# Patient Record
Sex: Male | Born: 1990
Health system: Southern US, Community
[De-identification: ages and names within clinical notes are randomized; demographics above are authoritative.]

## PROBLEM LIST (undated history)

## (undated) DIAGNOSIS — K219 Gastro-esophageal reflux disease without esophagitis: Secondary | ICD-10-CM

## (undated) DIAGNOSIS — J189 Pneumonia, unspecified organism: Secondary | ICD-10-CM

## (undated) DIAGNOSIS — F419 Anxiety disorder, unspecified: Secondary | ICD-10-CM

## (undated) DIAGNOSIS — I514 Myocarditis, unspecified: Secondary | ICD-10-CM

## (undated) DIAGNOSIS — I10 Essential (primary) hypertension: Secondary | ICD-10-CM

---
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
## 2011-01-19 LAB — DIFFERENTIAL
Eosinophils Absolute: 0.4 10*3/uL (ref 0.0–1.2)
Eosinophils Relative: 5 % (ref 0–5)
Lymphs Abs: 2.6 10*3/uL (ref 1.1–4.8)
Neutro Abs: 3.1 10*3/uL (ref 1.7–8.0)
Neutrophils Relative %: 43 % (ref 43–71)

## 2011-01-19 LAB — INFLUENZA A & B ANTIBODIES
Influenza A Virus Ab, IgG: 1.51 IV
Influenza A Virus Ab, IgM: 0.22 IV
Influenza B Virus Ab, IgM: 0.09 IV
Influenza B ab, IgG: 1.73 IV

## 2011-01-19 LAB — TROPONIN I: Troponin I: 2.76 ng/mL (ref 0.00–0.06)

## 2011-01-19 LAB — RAPID URINE DRUG SCREEN, HOSP PERFORMED
Opiates: NOT DETECTED
Tetrahydrocannabinol: POSITIVE — AB

## 2011-01-19 LAB — COXSACKIE B VIRUS ANTIBODIES
Coxsackie B1 Ab: <1:8 {titer}
Coxsackie B3 Ab: <1:8 {titer}
Coxsackie B6 Ab: <1:8 {titer}

## 2011-01-19 LAB — ECHOVIRUS ABS PANEL (CSF)
Echovirus Ab Type 11: <1:10 {titer}
Echovirus Ab Type 6: <1:10 {titer}
Echovirus Ab Type 7: <1:10 {titer}
Echovirus Ab Type 9: <1:10 {titer}

## 2011-01-19 LAB — CBC
Hemoglobin: 14.1 g/dL (ref 12.0–16.0)
MCHC: 34.1 g/dL (ref 31.0–37.0)
MCV: 88.4 fL (ref 78.0–98.0)
Platelets: 200 10*3/uL (ref 150–400)
RDW: 12.9 % (ref 11.4–15.5)
WBC: 7.1 10*3/uL (ref 4.5–13.5)

## 2011-01-19 LAB — URINALYSIS, ROUTINE W REFLEX MICROSCOPIC
Bilirubin Urine: NEGATIVE
Nitrite: NEGATIVE
Urobilinogen, UA: 0.2 mg/dL (ref 0.0–1.0)
pH: 6 (ref 5.0–8.0)

## 2011-01-19 LAB — EBV AB TO VIRAL CAPSID AG PNL, IGG+IGM: EBV VCA IgM: 0.2 {ISR}

## 2011-01-19 LAB — ADENOVIRUS ANTIBODIES: ADP: 0.26 IV

## 2011-01-19 LAB — POCT CARDIAC MARKERS: CKMB, poc: 54.5 ng/mL (ref 1.0–8.0)

## 2011-01-19 LAB — CMV ABS, IGG+IGM (CYTOMEGALOVIRUS)
CMV IgM: <8 AU/mL (ref <30.0)
Cytomegalovirus Ab-IgG: 4.4 U/mL — ABNORMAL HIGH (ref <0.4)

## 2011-05-29 IMAGING — CR DG HAND COMPLETE 3+V*R*
3 series · 3 of 3 positions shown · non-contrast
Comparison: None.

CLINICAL DATA: Hand injury and pain.

RIGHT HAND - COMPLETE 3+ VIEW

[x hand pa right]
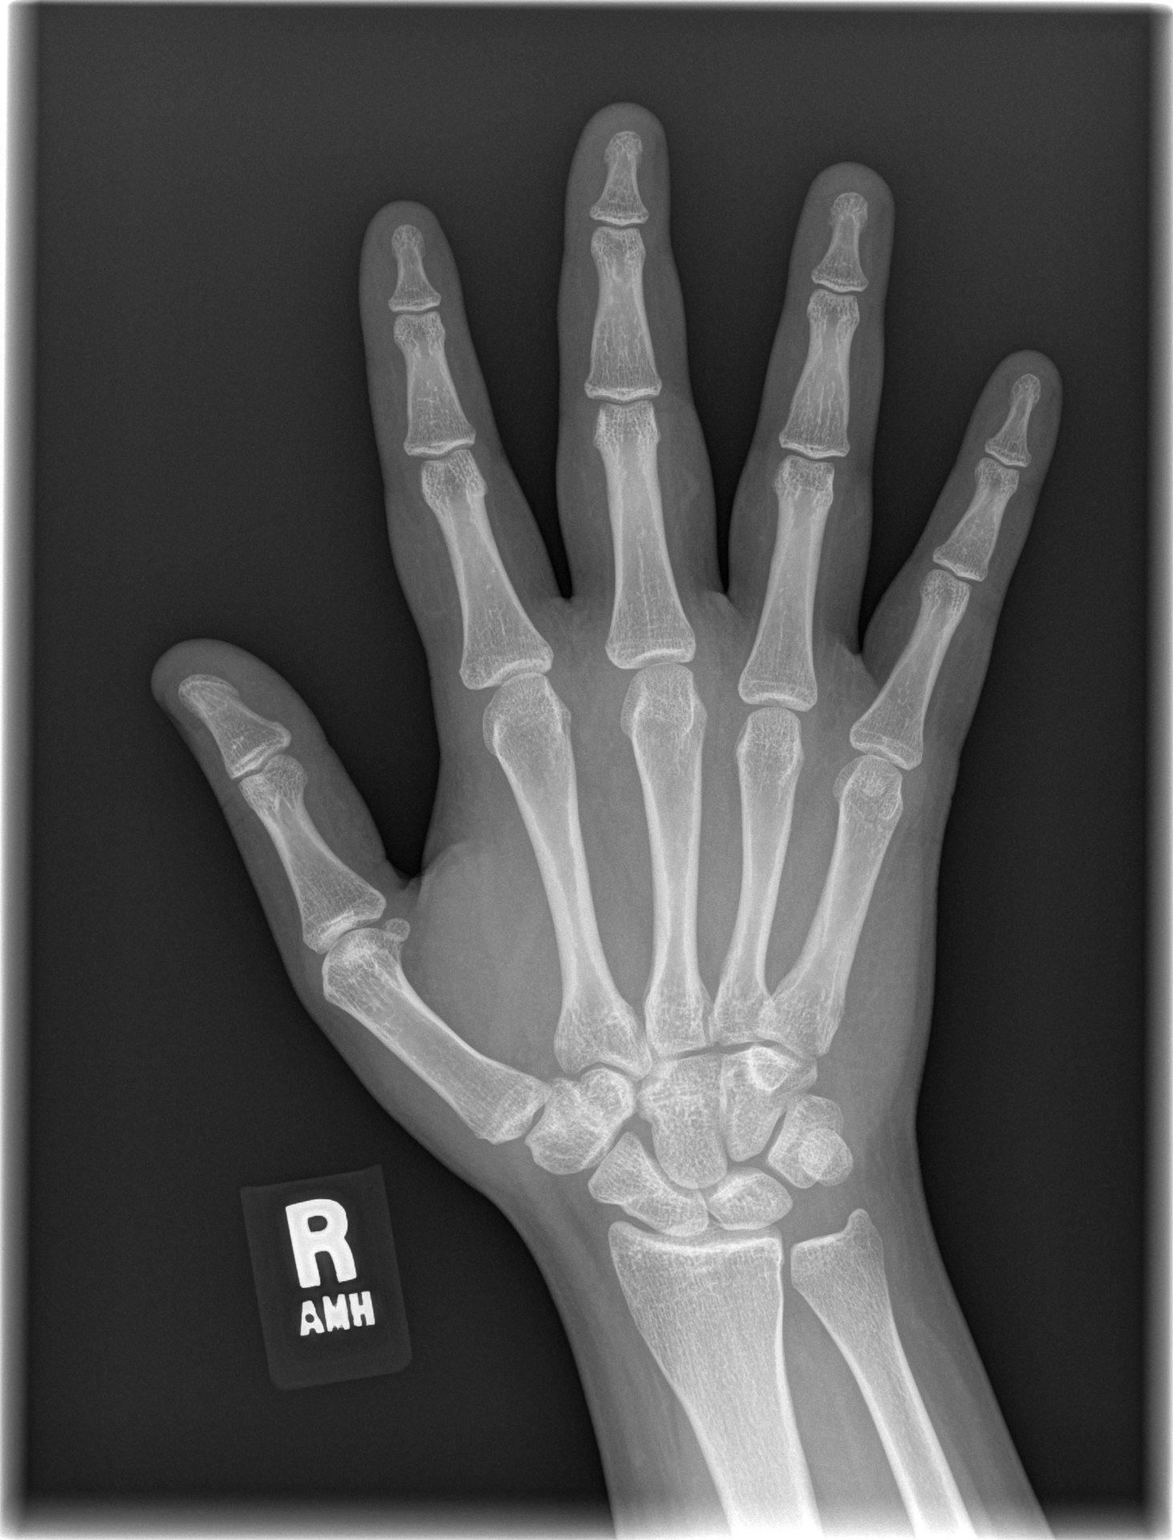

[x hand oblique right]
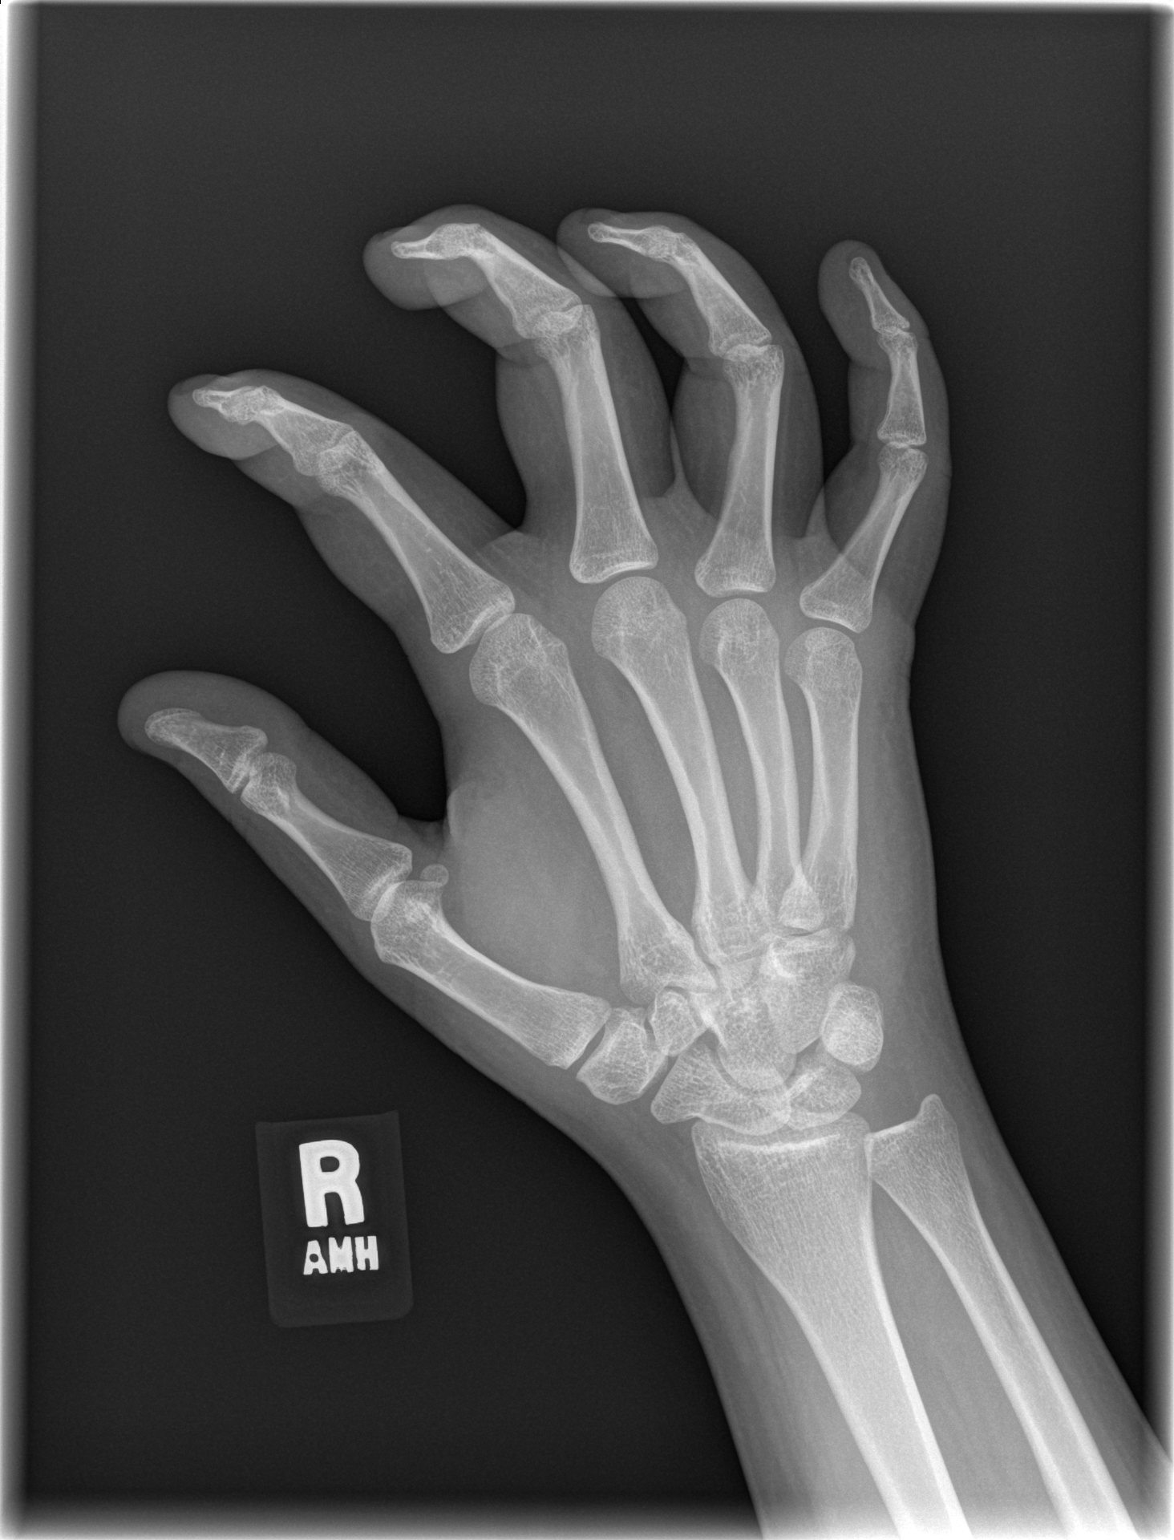

[x hand lat right]
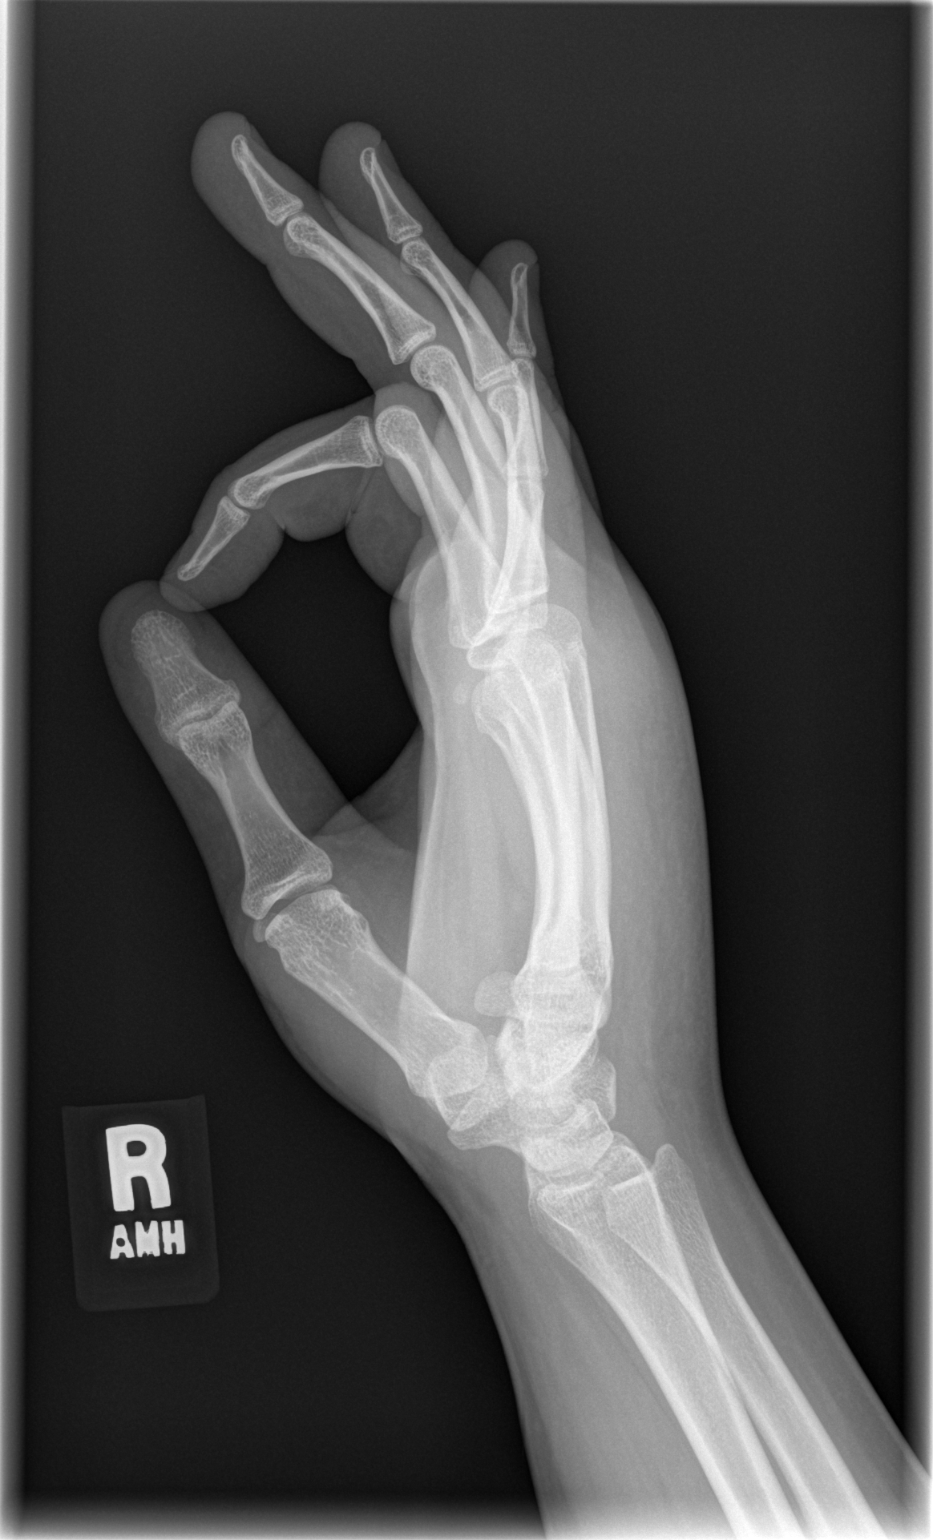

[3 of 3 positions shown; findings below may reference images not displayed]

FINDINGS: Soft tissues of the hand appear swollen.  No fracture or
dislocation is identified.
IMPRESSION: Soft tissue swelling without underlying fracture.

## 2012-03-19 ENCOUNTER — Emergency Department (HOSPITAL_BASED_OUTPATIENT_CLINIC_OR_DEPARTMENT_OTHER)
Admission: EM | Admit: 2012-03-19 | Discharge: 2012-03-19 | Disposition: A | Discharge status: Home / Self Care | Payer: Self-pay | Attending: Emergency Medicine | Admitting: Emergency Medicine

## 2012-03-19 ENCOUNTER — Encounter (HOSPITAL_BASED_OUTPATIENT_CLINIC_OR_DEPARTMENT_OTHER): Payer: Self-pay

## 2012-03-19 ENCOUNTER — Encounter (HOSPITAL_BASED_OUTPATIENT_CLINIC_OR_DEPARTMENT_OTHER): Payer: Self-pay | Admitting: *Deleted

## 2012-03-19 ENCOUNTER — Emergency Department (HOSPITAL_BASED_OUTPATIENT_CLINIC_OR_DEPARTMENT_OTHER): Payer: Self-pay

## 2012-03-19 ENCOUNTER — Emergency Department (HOSPITAL_BASED_OUTPATIENT_CLINIC_OR_DEPARTMENT_OTHER)
Admission: EM | Admit: 2012-03-19 | Discharge: 2012-03-19 | Discharge status: Against Medical Advice | Payer: Self-pay | Attending: Emergency Medicine | Admitting: Emergency Medicine

## 2012-03-19 DIAGNOSIS — F17200 Nicotine dependence, unspecified, uncomplicated: Secondary | ICD-10-CM | POA: Insufficient documentation

## 2012-03-19 DIAGNOSIS — R059 Cough, unspecified: Secondary | ICD-10-CM | POA: Insufficient documentation

## 2012-03-19 DIAGNOSIS — R05 Cough: Secondary | ICD-10-CM | POA: Insufficient documentation

## 2012-03-19 DIAGNOSIS — R5381 Other malaise: Secondary | ICD-10-CM | POA: Insufficient documentation

## 2012-03-19 DIAGNOSIS — R630 Anorexia: Secondary | ICD-10-CM | POA: Insufficient documentation

## 2012-03-19 DIAGNOSIS — R5383 Other fatigue: Secondary | ICD-10-CM | POA: Insufficient documentation

## 2012-03-19 DIAGNOSIS — M549 Dorsalgia, unspecified: Secondary | ICD-10-CM | POA: Insufficient documentation

## 2012-03-19 DIAGNOSIS — R52 Pain, unspecified: Secondary | ICD-10-CM | POA: Insufficient documentation

## 2012-03-19 DIAGNOSIS — R079 Chest pain, unspecified: Secondary | ICD-10-CM | POA: Insufficient documentation

## 2012-03-19 DIAGNOSIS — J189 Pneumonia, unspecified organism: Secondary | ICD-10-CM | POA: Insufficient documentation

## 2012-03-19 DIAGNOSIS — R6883 Chills (without fever): Secondary | ICD-10-CM | POA: Insufficient documentation

## 2012-03-19 DIAGNOSIS — Z8679 Personal history of other diseases of the circulatory system: Secondary | ICD-10-CM | POA: Insufficient documentation

## 2012-03-19 HISTORY — DX: Myocarditis, unspecified: I51.4

## 2012-03-19 LAB — CBC
HCT: 40.8 % (ref 39.0–52.0)
Hemoglobin: 14.4 g/dL (ref 13.0–17.0)
MCH: 30.7 pg (ref 26.0–34.0)
MCHC: 35.3 g/dL (ref 30.0–36.0)

## 2012-03-19 LAB — BASIC METABOLIC PANEL
BUN: 8 mg/dL (ref 6–23)
CO2: 26 mEq/L (ref 19–32)
Calcium: 9.6 mg/dL (ref 8.4–10.5)
GFR calc non Af Amer: >90 mL/min (ref >90)
Glucose, Bld: 134 mg/dL — ABNORMAL HIGH (ref 70–99)
Potassium: 3.5 mEq/L (ref 3.5–5.1)

## 2012-03-19 MED ORDER — LEVOFLOXACIN 500 MG PO TABS
500.0000 mg | ORAL_TABLET | Freq: Once | ORAL | Status: AC
  Administered 2012-03-19: 500 mg via ORAL
  Filled 2012-03-19: qty 1

## 2012-03-19 MED ORDER — LEVOFLOXACIN 500 MG PO TABS: 500.0000 mg | ORAL_TABLET | Freq: Every day | ORAL | Status: DC

## 2012-03-19 MED ORDER — HYDROCODONE-HOMATROPINE 5-1.5 MG/5ML PO SYRP: 2.5000 mL | ORAL_SOLUTION | Freq: Four times a day (QID) | ORAL | Status: DC | PRN

## 2012-03-19 NOTE — ED Notes (Signed)
C/o generalized body aches, fatigue, chills x 3 days

## 2012-03-19 NOTE — ED Provider Notes (Signed)
History     CSN: [id number]  Arrival date & time 03/19/12  1532   First MD Initiated Contact with Patient 03/19/12 1624      Chief Complaint  Patient presents with  . URI    (Consider location/radiation/quality/duration/timing/severity/associated sxs/prior treatment) HPI The patient presents with concerns of complaints that began approximately 3 days ago.  Over the past 3 days he has had diffuse generalized discomfort, fatigue, chills.  He notes concurrent cough, mild anorexia.  No relief with OTC medication.  He denies confusion, disorientation, lightheadedness, syncope, chest pain.  He does endorse pain with coughing, and both his chest and back. He notes that he was in his usual state of health until this point. The patient smokes. Past Medical History  Diagnosis Date  . Myocarditis     History reviewed. No pertinent past surgical history.  History reviewed. No pertinent family history.  History  Substance Use Topics  . Smoking status: Current Every Day Smoker  . Smokeless tobacco: Not on file  . Alcohol Use: No      Review of Systems  Constitutional:       Per HPI, otherwise negative  HENT:       Per HPI, otherwise negative  Eyes: Negative.   Respiratory:       Per HPI, otherwise negative  Cardiovascular:       Per HPI, otherwise negative  Gastrointestinal: Negative for vomiting.  Genitourinary: Negative.   Musculoskeletal:       Per HPI, otherwise negative  Skin: Negative.   Neurological: Negative for syncope.    Allergies  Amoxil  Home Medications  No current outpatient prescriptions on file.  BP 144/78  Pulse 111  Temp 99 F (37.2 C) (Oral)  Resp 16  Ht 5\' 5"  (1.651 m)  Wt 180 lb (81.647 kg)  BMI 29.95 kg/m2  SpO2 100%  Physical Exam  Nursing note and vitals reviewed. Constitutional: He is oriented to person, place, and time. He appears well-developed. No distress.  HENT:  Head: Normocephalic and atraumatic.  Eyes: Conjunctivae normal  and EOM are normal.  Cardiovascular: Regular rhythm.  Tachycardia present.   Pulmonary/Chest: Effort normal. No stridor. No respiratory distress. He has decreased breath sounds in the right upper field, the right middle field and the right lower field. He has no wheezes. He has no rhonchi.  Abdominal: He exhibits no distension.  Musculoskeletal: He exhibits no edema.  Neurological: He is alert and oriented to person, place, and time.  Skin: Skin is warm and dry.  Psychiatric: He has a normal mood and affect.    ED Course  Procedures (including critical care time)  Labs Reviewed  BASIC METABOLIC PANEL - Abnormal; Notable for the following:    Glucose, Bld 134 (*)     All other components within normal limits  CBC   Dg Chest 2 View  03/19/2012  *RADIOLOGY REPORT*  Clinical Data: Cough, congestion, fever, history smoking, myocarditis  CHEST - 2 VIEW  Comparison: 09/15/2009  Findings: Normal heart size, mediastinal contours, and pulmonary vascularity. Mild infiltrate identified in right lower lobe consistent with pneumonia. Remaining lungs clear. No pleural effusion or pneumothorax.  IMPRESSION: Right lower lobe infiltrate consistent with pneumonia.   Original Report Authenticated By: Ulyses Southward, M.D.      No diagnosis found.  Pulse ox 99% room air normal I interpreted the x-ray, demonstrated it to the patient. We discussed the need for smoking cessation.   MDM  This generally well young male  presents with several days of cough, generalized complaints.  On exam she is uncomfortable appearing, though in no distress.  The patient is mildly tachycardic, but afebrile.  He does have diminished lung sounds on the right.  This is consistent with the pneumonia demonstrated on x-ray.  He was started on appropriate therapy for community acquired pneumonia.  He was discharged in stable condition.      Gerhard Munch, MD 03/19/12 [phone]

## 2012-03-19 NOTE — ED Notes (Signed)
Pt c/o flu like symptoms  x 2 days 

## 2012-04-07 ENCOUNTER — Emergency Department (HOSPITAL_BASED_OUTPATIENT_CLINIC_OR_DEPARTMENT_OTHER)
Admission: EM | Admit: 2012-04-07 | Discharge: 2012-04-07 | Disposition: A | Discharge status: Home / Self Care | Payer: Self-pay | Attending: Emergency Medicine | Admitting: Emergency Medicine

## 2012-04-07 ENCOUNTER — Encounter (HOSPITAL_BASED_OUTPATIENT_CLINIC_OR_DEPARTMENT_OTHER): Payer: Self-pay | Admitting: *Deleted

## 2012-04-07 DIAGNOSIS — H60399 Other infective otitis externa, unspecified ear: Secondary | ICD-10-CM | POA: Insufficient documentation

## 2012-04-07 DIAGNOSIS — I514 Myocarditis, unspecified: Secondary | ICD-10-CM | POA: Insufficient documentation

## 2012-04-07 DIAGNOSIS — H6010 Cellulitis of external ear, unspecified ear: Secondary | ICD-10-CM

## 2012-04-07 DIAGNOSIS — F17200 Nicotine dependence, unspecified, uncomplicated: Secondary | ICD-10-CM | POA: Insufficient documentation

## 2012-04-07 MED ORDER — CLINDAMYCIN HCL 150 MG PO CAPS
300.0000 mg | ORAL_CAPSULE | Freq: Once | ORAL | Status: AC
  Administered 2012-04-07: 300 mg via ORAL
  Filled 2012-04-07: qty 2

## 2012-04-07 MED ORDER — CLINDAMYCIN HCL 150 MG PO CAPS: 150.0000 mg | ORAL_CAPSULE | Freq: Four times a day (QID) | ORAL | Status: DC

## 2012-04-07 MED ORDER — OXYCODONE-ACETAMINOPHEN 5-325 MG PO TABS: 1.0000 | ORAL_TABLET | ORAL | Status: AC | PRN

## 2012-04-07 NOTE — ED Provider Notes (Signed)
History     CSN: [id number]  Arrival date & time 04/07/12  1052   First MD Initiated Contact with Patient 04/07/12 1135      Chief Complaint  Patient presents with  . Otalgia    (Consider location/radiation/quality/duration/timing/severity/associated sxs/prior treatment) HPI Patient with pain and swelling external right ear.  Began with symptoms 2 days ago. And swelling increased.  Patient with scratching in throat.  No fever or chills.   Past Medical History  Diagnosis Date  . Myocarditis     History reviewed. No pertinent past surgical history.  No family history on file.  History  Substance Use Topics  . Smoking status: Current Every Day Smoker -- 0.5 packs/day    Types: Cigarettes  . Smokeless tobacco: Not on file  . Alcohol Use: No      Review of Systems  All other systems reviewed and are negative.    Allergies  Amoxil  Home Medications   Current Outpatient Rx  Name  Route  Sig  Dispense  Refill  . HYDROCODONE-HOMATROPINE 5-1.5 MG/5ML PO SYRP   Oral   Take 2.5 mLs by mouth every 6 (six) hours as needed for cough or pain.   30 mL   0   . LEVOFLOXACIN 500 MG PO TABS   Oral   Take 1 tablet (500 mg total) by mouth daily.   6 tablet   0     BP 134/85  Pulse 108  Temp 98 F (36.7 C) (Oral)  Resp 18  SpO2 98%  Physical Exam  Nursing note and vitals reviewed. Constitutional: He is oriented to person, place, and time. He appears well-developed and well-nourished.  HENT:  Head: Normocephalic and atraumatic.  Right Ear: External ear normal.  Left Ear: External ear normal.  Ears:  Nose: Nose normal.  Mouth/Throat: Oropharynx is clear and moist.       Patient with erythema of ear with some exoriation, no fluctance or masses.   Eyes: Conjunctivae normal and EOM are normal. Pupils are equal, round, and reactive to light.  Neck: Normal range of motion. Neck supple.  Cardiovascular: Normal rate, regular rhythm, normal heart sounds and intact  distal pulses.   Pulmonary/Chest: Effort normal and breath sounds normal. No respiratory distress. He has no wheezes. He exhibits no tenderness.  Abdominal: Soft. Bowel sounds are normal. He exhibits no distension and no mass. There is no tenderness. There is no guarding.  Musculoskeletal: Normal range of motion.  Neurological: He is alert and oriented to person, place, and time. He has normal reflexes. He exhibits normal muscle tone. Coordination normal.  Skin: Skin is warm and dry.  Psychiatric: He has a normal mood and affect. His behavior is normal. Judgment and thought content normal.    ED Course  Procedures (including critical care time)  Labs Reviewed - No data to display No results found.   No diagnosis found.    MDM  Patient recently on levaquin.  Plan clindamycin and advised warm compresses and return for recheck if worse at any time or two days.        Hilario Quarry, MD 04/12/12 1255

## 2012-04-07 NOTE — ED Notes (Signed)
Pain in his right ear x 3 days. Swelling, redness and pain radiating down his neck. States it started as a pimple on the outside of his ear that he "popped".

## 2012-04-12 ENCOUNTER — Emergency Department (HOSPITAL_BASED_OUTPATIENT_CLINIC_OR_DEPARTMENT_OTHER)
Admission: EM | Admit: 2012-04-12 | Discharge: 2012-04-12 | Disposition: A | Discharge status: Home / Self Care | Payer: Self-pay | Attending: Emergency Medicine | Admitting: Emergency Medicine

## 2012-04-12 ENCOUNTER — Encounter (HOSPITAL_BASED_OUTPATIENT_CLINIC_OR_DEPARTMENT_OTHER): Payer: Self-pay | Admitting: *Deleted

## 2012-04-12 DIAGNOSIS — F17200 Nicotine dependence, unspecified, uncomplicated: Secondary | ICD-10-CM | POA: Insufficient documentation

## 2012-04-12 DIAGNOSIS — H7290 Unspecified perforation of tympanic membrane, unspecified ear: Secondary | ICD-10-CM | POA: Insufficient documentation

## 2012-04-12 DIAGNOSIS — Z8679 Personal history of other diseases of the circulatory system: Secondary | ICD-10-CM | POA: Insufficient documentation

## 2012-04-12 DIAGNOSIS — H60399 Other infective otitis externa, unspecified ear: Secondary | ICD-10-CM | POA: Insufficient documentation

## 2012-04-12 DIAGNOSIS — H6090 Unspecified otitis externa, unspecified ear: Secondary | ICD-10-CM

## 2012-04-12 MED ORDER — CIPROFLOXACIN-DEXAMETHASONE 0.3-0.1 % OT SUSP
4.0000 [drp] | Freq: Two times a day (BID) | OTIC | Status: DC
  Administered 2012-04-12: 4 [drp] via OTIC
  Filled 2012-04-12: qty 7.5

## 2012-04-12 NOTE — ED Notes (Signed)
Here for cellulitis right ear about a week ago. States ear popped and he lost hearing about 3 days ago. Bleeding from same, but none at present.

## 2012-04-12 NOTE — ED Provider Notes (Signed)
History     CSN: [id number]  Arrival date & time 04/12/12  1300   First MD Initiated Contact with Patient 04/12/12 1440      Chief Complaint  Patient presents with  . Otalgia    (Consider location/radiation/quality/duration/timing/severity/associated sxs/prior treatment) HPI Pt reports he was treated for ear lobe cellulitis about a week ago, finished the clinda and was doing better until three days ago when he head a pop and lost hearing in his R ear. He has had some bloody drainage from that ear since then. Minimal pain now.   Past Medical History  Diagnosis Date  . Myocarditis     History reviewed. No pertinent past surgical history.  History reviewed. No pertinent family history.  History  Substance Use Topics  . Smoking status: Current Every Day Smoker -- 0.5 packs/day    Types: Cigarettes  . Smokeless tobacco: Not on file  . Alcohol Use: No      Review of Systems All other systems reviewed and are negative except as noted in HPI. '  Allergies  Amoxil  Home Medications   Current Outpatient Rx  Name  Route  Sig  Dispense  Refill  . CLINDAMYCIN HCL 150 MG PO CAPS   Oral   Take 1 capsule (150 mg total) by mouth every 6 (six) hours.   28 capsule   0   . HYDROCODONE-HOMATROPINE 5-1.5 MG/5ML PO SYRP   Oral   Take 2.5 mLs by mouth every 6 (six) hours as needed for cough or pain.   30 mL   0   . LEVOFLOXACIN 500 MG PO TABS   Oral   Take 1 tablet (500 mg total) by mouth daily.   6 tablet   0   . OXYCODONE-ACETAMINOPHEN 5-325 MG PO TABS   Oral   Take 1 tablet by mouth every 4 (four) hours as needed for pain.   10 tablet   0     BP 125/73  Pulse 81  Temp 98.3 F (36.8 C) (Oral)  Resp 14  Wt 190 lb (86.183 kg)  SpO2 100%  Physical Exam  Nursing note and vitals reviewed. Constitutional: He is oriented to person, place, and time. He appears well-developed and well-nourished.  HENT:  Head: Normocephalic and atraumatic.  Right Ear: External  ear and ear canal normal.  Left Ear: Tympanic membrane and external ear normal.       Distal external canal and TM with erythema, crusting, and induration, no definite TM perforation seen  Eyes: EOM are normal. Pupils are equal, round, and reactive to light.  Neck: Normal range of motion. Neck supple.  Cardiovascular: Normal rate, normal heart sounds and intact distal pulses.   Pulmonary/Chest: Effort normal and breath sounds normal.  Abdominal: Bowel sounds are normal. He exhibits no distension. There is no tenderness.  Musculoskeletal: Normal range of motion. He exhibits no edema and no tenderness.  Neurological: He is alert and oriented to person, place, and time. He has normal strength. No cranial nerve deficit or sensory deficit.  Skin: Skin is warm and dry. No rash noted.  Psychiatric: He has a normal mood and affect.    ED Course  Procedures (including critical care time)  Labs Reviewed - No data to display No results found.   No diagnosis found.    MDM  Suspect TM perforation, although no definite perf seen on exam. Pt has signs of otitis externa, no otitis media. Topical Abx and ENT referral.  Charles B. Bernette Mayers, MD 04/12/12 1453

## 2016-05-28 ENCOUNTER — Encounter: Payer: Self-pay | Admitting: Family Medicine

## 2016-05-28 ENCOUNTER — Ambulatory Visit (INDEPENDENT_AMBULATORY_CARE_PROVIDER_SITE_OTHER): Payer: BLUE CROSS/BLUE SHIELD | Admitting: Family Medicine

## 2016-05-28 VITALS — BP 131/91 | HR 112 | Temp 97.9°F | Ht 65.0 in | Wt 206.0 lb

## 2016-05-28 DIAGNOSIS — J028 Acute pharyngitis due to other specified organisms: Secondary | ICD-10-CM | POA: Diagnosis not present

## 2016-05-28 DIAGNOSIS — B9789 Other viral agents as the cause of diseases classified elsewhere: Secondary | ICD-10-CM

## 2016-05-28 DIAGNOSIS — J029 Acute pharyngitis, unspecified: Secondary | ICD-10-CM

## 2016-05-28 LAB — CULTURE, GROUP A STREP

## 2016-05-28 LAB — RAPID STREP SCREEN (MED CTR MEBANE ONLY): Strep Gp A Ag, IA W/Reflex: NEGATIVE

## 2016-05-28 NOTE — Progress Notes (Signed)
BP (!) 131/91   Pulse (!) 112   Temp 97.9 F (36.6 C) (Oral)   Ht 5\' 5"  (1.651 m)   Wt 206 lb (93.4 kg)   BMI 34.28 kg/m    Subjective:    Patient ID: Ricky Evans, male    DOB: 1990-09-03, 26 y.o.   MRN: [id number]  HPI: Ricky Evans is a 26 y.o. male presenting on 05/28/2016 for Sore Throat; Fever; and Nausea   HPI Sore throat and fever and congestion  Patient has been having sore throat and fever and congestion is been going on since yesterday. He has been having a cough that is mostly nonproductive at this point. He has been having a lot of nasal drainage and nasal congestion and postnasal drainage. He denies any sick contacts that he knows of. He has not missed really use anything over-the-counter for this just yet. He wanted to get tested for strep because he thinks he might have strep pharyngitis.  Relevant past medical, surgical, family and social history reviewed and updated as indicated. Interim medical history since our last visit reviewed. Allergies and medications reviewed and updated.  Review of Systems  Constitutional: Positive for fever. Negative for chills.  HENT: Positive for congestion, postnasal drip, rhinorrhea, sinus pressure, sneezing and sore throat. Negative for ear discharge, ear pain and voice change.   Eyes: Negative for pain, discharge, redness and visual disturbance.  Respiratory: Positive for cough. Negative for shortness of breath and wheezing.   Cardiovascular: Negative for chest pain and leg swelling.  Musculoskeletal: Negative for gait problem.  Skin: Negative for rash.  All other systems reviewed and are negative.   Per HPI unless specifically indicated above  Social History   Social History  . Marital status: Single    Spouse name: N/A  . Number of children: N/A  . Years of education: N/A   Occupational History  . Not on file.   Social History Main Topics  . Smoking status: Current Every Day Smoker    Packs/day: 0.50   Types: Cigarettes  . Smokeless tobacco: Never Used  . Alcohol use Yes     Comment: 4 beer, occasional binge  . Drug use: No  . Sexual activity: Yes    Birth control/ protection: IUD     Comment: girlfriend of 2 months,    Other Topics Concern  . Not on file   Social History Narrative  . No narrative on file    History reviewed. No pertinent surgical history.  Family History  Problem Relation Age of Onset  . Hypertension Mother   . Cancer Paternal Grandmother     lung  . Cancer Paternal Grandfather     lung    Allergies as of 05/28/2016      Reactions   Amoxil [amoxicillin] Hives      Medication List    as of 05/28/2016  2:38 PM   You have not been prescribed any medications.        Objective:    BP (!) 131/91   Pulse (!) 112   Temp 97.9 F (36.6 C) (Oral)   Ht 5\' 5"  (1.651 m)   Wt 206 lb (93.4 kg)   BMI 34.28 kg/m   Wt Readings from Last 3 Encounters:  05/28/16 206 lb (93.4 kg)  04/12/12 190 lb (86.2 kg)  03/19/12 180 lb (81.6 kg)    Physical Exam  Constitutional: He is oriented to person, place, and time. He appears well-developed and well-nourished.  No distress.  HENT:  Right Ear: Tympanic membrane, external ear and ear canal normal.  Left Ear: Tympanic membrane, external ear and ear canal normal.  Nose: Mucosal edema and rhinorrhea present. No sinus tenderness. No epistaxis. Right sinus exhibits maxillary sinus tenderness. Right sinus exhibits no frontal sinus tenderness. Left sinus exhibits maxillary sinus tenderness. Left sinus exhibits no frontal sinus tenderness.  Mouth/Throat: Uvula is midline and mucous membranes are normal. Posterior oropharyngeal edema and posterior oropharyngeal erythema present. No oropharyngeal exudate or tonsillar abscesses.  Eyes: Conjunctivae are normal. Right eye exhibits no discharge. Left eye exhibits no discharge. No scleral icterus.  Neck: Neck supple. No thyromegaly present.  Cardiovascular: Normal rate, regular  rhythm, normal heart sounds and intact distal pulses.   No murmur heard. Pulmonary/Chest: Effort normal and breath sounds normal. No respiratory distress. He has no wheezes. He has no rales.  Musculoskeletal: Normal range of motion. He exhibits no edema.  Lymphadenopathy:    He has no cervical adenopathy.  Neurological: He is alert and oriented to person, place, and time. Coordination normal.  Skin: Skin is warm and dry. No rash noted. He is not diaphoretic.  Psychiatric: He has a normal mood and affect. His behavior is normal.  Nursing note and vitals reviewed.  Strep, rapid: Negative    Assessment & Plan:   Problem List Items Addressed This Visit    None    Visit Diagnoses    Acute viral pharyngitis    -  Primary   Likely viral pharyngitis, strep negative, patient does not want Tamiflu, recommend Flonase, Mucinex, nasal saline, antihistamines.   Relevant Orders   Rapid strep screen (not at [facility])       Follow up plan: Return in about 4 weeks (around 06/25/2016), or if symptoms worsen or fail to improve, for Well adult exam and fasting labs.  Arville Care, MD [facility] 05/28/2016, 2:38 PM

## 2016-08-29 ENCOUNTER — Encounter (HOSPITAL_COMMUNITY): Payer: Self-pay | Admitting: Emergency Medicine

## 2016-08-29 ENCOUNTER — Ambulatory Visit (HOSPITAL_COMMUNITY): Admit: 2016-08-29 | Payer: Self-pay | Admitting: Cardiology

## 2016-08-29 ENCOUNTER — Emergency Department (HOSPITAL_COMMUNITY): Payer: Self-pay

## 2016-08-29 ENCOUNTER — Encounter (HOSPITAL_COMMUNITY)
Admission: EM | Disposition: A | Discharge status: Home / Self Care | Payer: Self-pay | Source: Home / Self Care | Attending: Cardiology

## 2016-08-29 ENCOUNTER — Inpatient Hospital Stay (HOSPITAL_COMMUNITY)
Admission: EM | Admit: 2016-08-29 | Discharge: 2016-08-30 | DRG: 287 | Disposition: A | Discharge status: Home / Self Care | Payer: Self-pay | Attending: Cardiology | Admitting: Cardiology

## 2016-08-29 DIAGNOSIS — G4733 Obstructive sleep apnea (adult) (pediatric): Secondary | ICD-10-CM | POA: Diagnosis present

## 2016-08-29 DIAGNOSIS — R072 Precordial pain: Secondary | ICD-10-CM

## 2016-08-29 DIAGNOSIS — Z8679 Personal history of other diseases of the circulatory system: Secondary | ICD-10-CM

## 2016-08-29 DIAGNOSIS — I309 Acute pericarditis, unspecified: Principal | ICD-10-CM | POA: Diagnosis present

## 2016-08-29 HISTORY — PX: LEFT HEART CATH AND CORONARY ANGIOGRAPHY: CATH118249

## 2016-08-29 HISTORY — DX: Essential (primary) hypertension: I10

## 2016-08-29 HISTORY — PX: CARDIAC CATHETERIZATION: SHX172

## 2016-08-29 HISTORY — DX: Pneumonia, unspecified organism: J18.9

## 2016-08-29 HISTORY — DX: Anxiety disorder, unspecified: F41.9

## 2016-08-29 HISTORY — DX: Gastro-esophageal reflux disease without esophagitis: K21.9

## 2016-08-29 LAB — COMPREHENSIVE METABOLIC PANEL
ALT: 267 U/L — AB (ref 17–63)
ANION GAP: 14 (ref 5–15)
AST: 190 U/L — ABNORMAL HIGH (ref 15–41)
Albumin: 4 g/dL (ref 3.5–5.0)
Alkaline Phosphatase: 62 U/L (ref 38–126)
BUN: 11 mg/dL (ref 6–20)
CHLORIDE: 104 mmol/L (ref 101–111)
CO2: 21 mmol/L — ABNORMAL LOW (ref 22–32)
CREATININE: 0.71 mg/dL (ref 0.61–1.24)
Calcium: 9 mg/dL (ref 8.9–10.3)
GFR calc non Af Amer: >60 mL/min (ref >60)
Glucose, Bld: 124 mg/dL — ABNORMAL HIGH (ref 65–99)
Potassium: 3.6 mmol/L (ref 3.5–5.1)
SODIUM: 139 mmol/L (ref 135–145)
Total Bilirubin: 0.6 mg/dL (ref 0.3–1.2)
Total Protein: 7.5 g/dL (ref 6.5–8.1)

## 2016-08-29 LAB — RAPID URINE DRUG SCREEN, HOSP PERFORMED
Amphetamines: NOT DETECTED
BARBITURATES: NOT DETECTED
BENZODIAZEPINES: NOT DETECTED
COCAINE: NOT DETECTED
Opiates: NOT DETECTED
TETRAHYDROCANNABINOL: NOT DETECTED

## 2016-08-29 LAB — PROTIME-INR
INR: 1.08
PROTHROMBIN TIME: 14 s (ref 11.4–15.2)

## 2016-08-29 LAB — LIPID PANEL
CHOL/HDL RATIO: 4 ratio
CHOLESTEROL: 178 mg/dL (ref 0–200)
HDL: 45 mg/dL (ref >40)
LDL Cholesterol: 95 mg/dL (ref 0–99)
TRIGLYCERIDES: 189 mg/dL — AB (ref <150)
VLDL: 38 mg/dL (ref 0–40)

## 2016-08-29 LAB — CBC WITH DIFFERENTIAL/PLATELET
BASOS ABS: 0 10*3/uL (ref 0.0–0.1)
BASOS PCT: 0 %
EOS ABS: 0.3 10*3/uL (ref 0.0–0.7)
EOS PCT: 4 %
HCT: 42.7 % (ref 39.0–52.0)
Hemoglobin: 14.4 g/dL (ref 13.0–17.0)
Lymphocytes Relative: 29 %
Lymphs Abs: 2.3 10*3/uL (ref 0.7–4.0)
MCH: 31 pg (ref 26.0–34.0)
MCHC: 33.7 g/dL (ref 30.0–36.0)
MCV: 92 fL (ref 78.0–100.0)
MONO ABS: 0.9 10*3/uL (ref 0.1–1.0)
Monocytes Relative: 12 %
Neutro Abs: 4.3 10*3/uL (ref 1.7–7.7)
Neutrophils Relative %: 55 %
PLATELETS: 250 10*3/uL (ref 150–400)
RBC: 4.64 MIL/uL (ref 4.22–5.81)
RDW: 13.4 % (ref 11.5–15.5)
WBC: 7.9 10*3/uL (ref 4.0–10.5)

## 2016-08-29 LAB — TROPONIN I
TROPONIN I: 4.46 ng/mL — AB (ref <0.03)
TROPONIN I: 3.15 ng/mL — AB (ref <0.03)
Troponin I: 7.15 ng/mL (ref <0.03)
Troponin I: 5.06 ng/mL (ref <0.03)

## 2016-08-29 LAB — APTT: APTT: 34 s (ref 24–36)

## 2016-08-29 LAB — TSH: TSH: 1.592 u[IU]/mL (ref 0.350–4.500)

## 2016-08-29 LAB — GLUCOSE, CAPILLARY: GLUCOSE-CAPILLARY: 114 mg/dL — AB (ref 65–99)

## 2016-08-29 LAB — I-STAT TROPONIN, ED: TROPONIN I, POC: 5.03 ng/mL — AB (ref 0.00–0.08)

## 2016-08-29 LAB — SEDIMENTATION RATE: SED RATE: 42 mm/h — AB (ref 0–16)

## 2016-08-29 LAB — C-REACTIVE PROTEIN: CRP: 5.9 mg/dL — ABNORMAL HIGH (ref <1.0)

## 2016-08-29 LAB — BRAIN NATRIURETIC PEPTIDE: B NATRIURETIC PEPTIDE 5: 44.9 pg/mL (ref 0.0–100.0)

## 2016-08-29 LAB — HIV ANTIBODY (ROUTINE TESTING W REFLEX): HIV Screen 4th Generation wRfx: NONREACTIVE

## 2016-08-29 SURGERY — LEFT HEART CATH AND CORONARY ANGIOGRAPHY
Anesthesia: LOCAL

## 2016-08-29 MED ORDER — HEPARIN (PORCINE) IN NACL 2-0.9 UNIT/ML-% IJ SOLN
INTRAMUSCULAR | Status: AC
  Filled 2016-08-29: qty 1000

## 2016-08-29 MED ORDER — COLCHICINE 0.6 MG PO TABS
0.6000 mg | ORAL_TABLET | Freq: Two times a day (BID) | ORAL | Status: DC
  Administered 2016-08-29 – 2016-08-30 (×3): 0.6 mg via ORAL
  Filled 2016-08-29 (×3): qty 1

## 2016-08-29 MED ORDER — FENTANYL CITRATE (PF) 100 MCG/2ML IJ SOLN
INTRAMUSCULAR | Status: AC
  Filled 2016-08-29: qty 2

## 2016-08-29 MED ORDER — IOPAMIDOL (ISOVUE-370) INJECTION 76%
INTRAVENOUS | Status: DC | PRN
  Administered 2016-08-29: 80 mL via INTRA_ARTERIAL

## 2016-08-29 MED ORDER — VERAPAMIL HCL 2.5 MG/ML IV SOLN
INTRAVENOUS | Status: AC
  Filled 2016-08-29: qty 2

## 2016-08-29 MED ORDER — ASPIRIN 81 MG PO CHEW
81.0000 mg | CHEWABLE_TABLET | Freq: Every day | ORAL | Status: DC
  Administered 2016-08-30: 81 mg via ORAL
  Filled 2016-08-29 (×2): qty 1

## 2016-08-29 MED ORDER — IOPAMIDOL (ISOVUE-370) INJECTION 76%
INTRAVENOUS | Status: AC
  Filled 2016-08-29: qty 125

## 2016-08-29 MED ORDER — SODIUM CHLORIDE 0.9% FLUSH: 3.0000 mL | Freq: Two times a day (BID) | INTRAVENOUS | Status: DC

## 2016-08-29 MED ORDER — ASPIRIN 81 MG PO CHEW: 324.0000 mg | CHEWABLE_TABLET | Freq: Once | ORAL | Status: DC

## 2016-08-29 MED ORDER — PANTOPRAZOLE SODIUM 40 MG PO TBEC
40.0000 mg | DELAYED_RELEASE_TABLET | Freq: Every day | ORAL | Status: DC
  Administered 2016-08-29 – 2016-08-30 (×2): 40 mg via ORAL
  Filled 2016-08-29 (×3): qty 1

## 2016-08-29 MED ORDER — SODIUM CHLORIDE 0.9 % IV SOLN: 250.0000 mL | INTRAVENOUS | Status: DC | PRN

## 2016-08-29 MED ORDER — SODIUM CHLORIDE 0.9 % WEIGHT BASED INFUSION
1.0000 mL/kg/h | INTRAVENOUS | Status: AC
  Administered 2016-08-29: 1 mL/kg/h via INTRAVENOUS

## 2016-08-29 MED ORDER — LIDOCAINE HCL (PF) 1 % IJ SOLN
INTRAMUSCULAR | Status: AC
  Filled 2016-08-29: qty 30

## 2016-08-29 MED ORDER — ONDANSETRON HCL 4 MG/2ML IJ SOLN
4.0000 mg | Freq: Once | INTRAMUSCULAR | Status: AC
  Administered 2016-08-29: 4 mg via INTRAVENOUS

## 2016-08-29 MED ORDER — ONDANSETRON HCL 4 MG/2ML IJ SOLN
INTRAMUSCULAR | Status: AC
  Filled 2016-08-29: qty 2

## 2016-08-29 MED ORDER — IBUPROFEN 800 MG PO TABS
800.0000 mg | ORAL_TABLET | Freq: Three times a day (TID) | ORAL | Status: DC
  Administered 2016-08-29 – 2016-08-30 (×5): 800 mg via ORAL
  Filled 2016-08-29 (×4): qty 1
  Filled 2016-08-29: qty 4
  Filled 2016-08-29 (×2): qty 1

## 2016-08-29 MED ORDER — MIDAZOLAM HCL 2 MG/2ML IJ SOLN
INTRAMUSCULAR | Status: AC
  Filled 2016-08-29: qty 2

## 2016-08-29 MED ORDER — SODIUM CHLORIDE 0.9% FLUSH
3.0000 mL | Freq: Two times a day (BID) | INTRAVENOUS | Status: DC
  Administered 2016-08-29 – 2016-08-30 (×2): 3 mL via INTRAVENOUS

## 2016-08-29 MED ORDER — ONDANSETRON HCL 4 MG/2ML IJ SOLN: 4.0000 mg | Freq: Four times a day (QID) | INTRAMUSCULAR | Status: DC | PRN

## 2016-08-29 MED ORDER — HEPARIN SODIUM (PORCINE) 5000 UNIT/ML IJ SOLN
4000.0000 [IU] | Freq: Once | INTRAMUSCULAR | Status: AC
  Administered 2016-08-29: 4000 [IU] via INTRAVENOUS

## 2016-08-29 MED ORDER — NITROGLYCERIN 0.4 MG SL SUBL: 0.4000 mg | SUBLINGUAL_TABLET | SUBLINGUAL | Status: DC | PRN

## 2016-08-29 MED ORDER — MORPHINE SULFATE (PF) 4 MG/ML IV SOLN
4.0000 mg | Freq: Once | INTRAVENOUS | Status: AC
  Administered 2016-08-29: 4 mg via INTRAVENOUS

## 2016-08-29 MED ORDER — HEPARIN (PORCINE) IN NACL 2-0.9 UNIT/ML-% IJ SOLN
INTRAMUSCULAR | Status: AC | PRN
  Administered 2016-08-29: 1000 mL

## 2016-08-29 MED ORDER — VERAPAMIL HCL 2.5 MG/ML IV SOLN
INTRAVENOUS | Status: DC | PRN
  Administered 2016-08-29: 07:00:00 via INTRA_ARTERIAL

## 2016-08-29 MED ORDER — LIDOCAINE HCL (PF) 1 % IJ SOLN
INTRAMUSCULAR | Status: DC | PRN
  Administered 2016-08-29: 2 mL

## 2016-08-29 MED ORDER — SODIUM CHLORIDE 0.9 % IV SOLN
INTRAVENOUS | Status: DC
  Administered 2016-08-29: 06:00:00 via INTRAVENOUS

## 2016-08-29 MED ORDER — ACETAMINOPHEN 325 MG PO TABS: 650.0000 mg | ORAL_TABLET | ORAL | Status: DC | PRN

## 2016-08-29 MED ORDER — MIDAZOLAM HCL 2 MG/2ML IJ SOLN
INTRAMUSCULAR | Status: DC | PRN
  Administered 2016-08-29: 2 mg via INTRAVENOUS

## 2016-08-29 MED ORDER — SODIUM CHLORIDE 0.9% FLUSH: 3.0000 mL | INTRAVENOUS | Status: DC | PRN

## 2016-08-29 MED ORDER — MORPHINE SULFATE (PF) 4 MG/ML IV SOLN
INTRAVENOUS | Status: AC
  Filled 2016-08-29: qty 1

## 2016-08-29 MED ORDER — HEPARIN SODIUM (PORCINE) 5000 UNIT/ML IJ SOLN
INTRAMUSCULAR | Status: AC
Start: 2016-08-29 — End: 2016-08-29
  Filled 2016-08-29: qty 1

## 2016-08-29 MED ORDER — FENTANYL CITRATE (PF) 100 MCG/2ML IJ SOLN
INTRAMUSCULAR | Status: DC | PRN
  Administered 2016-08-29: 25 ug via INTRAVENOUS

## 2016-08-29 SURGICAL SUPPLY — 11 items
CATH 5FR JL3.5 JR4 ANG PIG MP (CATHETERS) ×2 IMPLANT
DEVICE RAD COMP TR BAND LRG (VASCULAR PRODUCTS) ×2 IMPLANT
GLIDESHEATH SLEND SS 6F .021 (SHEATH) ×2 IMPLANT
GUIDEWIRE INQWIRE 1.5J.035X260 (WIRE) ×1 IMPLANT
INQWIRE 1.5J .035X260CM (WIRE) ×2
KIT ENCORE 26 ADVANTAGE (KITS) ×2 IMPLANT
KIT HEART LEFT (KITS) ×2 IMPLANT
PACK CARDIAC CATHETERIZATION (CUSTOM PROCEDURE TRAY) ×2 IMPLANT
SYR MEDRAD MARK V 150ML (SYRINGE) ×2 IMPLANT
TRANSDUCER W/STOPCOCK (MISCELLANEOUS) ×2 IMPLANT
TUBING CIL FLEX 10 FLL-RA (TUBING) ×2 IMPLANT

## 2016-08-29 NOTE — Care Management Note (Addendum)
Case Management Note  Patient Details  Name: Cecilie KicksRoger M Walkins MRN: [id number] Date of Birth: Nov 07, 1990  Subjective/Objective:    From home , s/p cath plan for .Anti-inflammatory therapy for acute myopericarditis per cath note. Patient has no pcp, NCM will ast patient with this, [facility] [place] and he stated he did not want to go there, [facility].  NCM called to schedule follow up and they do not have any apt, patient will need to call Tuesday at 8:30 to make apt.  [facility] except for narcs.                 Action/Plan: NCM will follow for dc needs.   Expected Discharge Date:                  Expected Discharge Plan:  Home/Self Care  In-House Referral:     Discharge planning Services  CM Consult  Post Acute Care Choice:    Choice offered to:     DME Arranged:    DME Agency:     HH Arranged:    HH Agency:     Status of Service:  Completed, signed off  If discussed at [employer] Length of Stay Meetings, dates discussed:    Additional Comments:  Leone Havenaylor, Linn Clavin Clinton, RN 08/29/2016, 8:31 AM

## 2016-08-29 NOTE — Progress Notes (Signed)
TR BAND REMOVAL  LOCATION:  right radial  DEFLATED PER PROTOCOL:  Yes.    TIME BAND OFF / DRESSING APPLIED:   0915   SITE UPON ARRIVAL:   Level 0  SITE AFTER BAND REMOVAL:  Level 0  CIRCULATION SENSATION AND MOVEMENT:  Within Normal Limits  Yes.    COMMENTS:

## 2016-08-29 NOTE — ED Provider Notes (Addendum)
[facility] Provider Note   CSN: [id number] Arrival date & time: 08/29/16  0501     History   Chief Complaint Chief Complaint  Patient presents with  . Nausea  . Chest Pain    HPI Ricky Evans is a 26 y.o. male.  HPI  This is a 26 year old male with a history of smoking, hypertension, and myocarditis at age 78 who presents with chest pain. He reports onset of chest pain at midnight. He reports that it is dull and anterior. It is nonradiating. Denies shortness of breath or diaphoresis. He was a sleep at the time. States the pain is similar to when he had myocarditis. He denies any fevers at home. He states he generally did not feel well on Monday with nausea and generalized malaise.  No early family history of heart disease. He currently has 4 out of 10 pain. He was given full dose aspirin by EMS. No recent travel or hospitalization. No leg swelling. Patient does drink daily. He denies any drug use specifically, cocaine.  Past Medical History:  Diagnosis Date  . Anxiety   . GERD (gastroesophageal reflux disease)   . HTN (hypertension)   . 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 Pneumonia X 1    Patient Active Problem List   Diagnosis Date Noted  . OSA (obstructive sleep apnea) 08/30/2016  . Acute myopericarditis 08/29/2016    Past Surgical History:  Procedure Laterality Date  . CARDIAC CATHETERIZATION  08/29/2016   "clean"  . LEFT HEART CATH AND CORONARY ANGIOGRAPHY N/A 08/29/2016   Procedure: Left Heart Cath and Coronary Angiography;  Surgeon: Swaziland, Peter M, MD;  Location: [facility] INVASIVE CV LAB;  Service: Cardiovascular;  Laterality: N/A;       Home Medications    Prior to Admission medications   Medication Sig Start Date End Date Taking? Authorizing Provider  ranitidine (ZANTAC) 150 MG tablet Take 150 mg by mouth daily.   Yes [provider]  colchicine 0.6 MG tablet Take 1 tablet (0.6 mg total) by mouth 2 (two) times  daily. 08/30/16   [facility], Roe Rutherford, PA  ibuprofen (ADVIL,MOTRIN) 800 MG tablet Take 1 tablet (800 mg total) by mouth 3 (three) times daily with meals. 08/30/16   [facility], Roe Rutherford, PA    Family History Family History  Problem Relation Age of Onset  . Hypertension Mother   . Cancer Paternal Grandmother        lung  . Cancer Paternal Grandfather        lung    Social History Social History  Substance Use Topics  . Smoking status: Current Every Day Smoker    Packs/day: 0.50    Years: 6.00    Types: Cigarettes  . Smokeless tobacco: Never Used  . Alcohol use 25.2 oz/week    42 Cans of beer per week     Comment: 08/29/2016 "@ least 6 plus beers/day"     Allergies   Amoxil [amoxicillin]   Review of Systems Review of Systems  Constitutional: Negative for diaphoresis and fever.  Respiratory: Negative for shortness of breath.   Cardiovascular: Positive for chest pain. Negative for leg swelling.  Gastrointestinal: Positive for nausea. Negative for vomiting.  Neurological: Negative for tremors and headaches.  Psychiatric/Behavioral: The patient is nervous/anxious.   All other systems reviewed and are negative.    Physical Exam Updated Vital Signs BP 126/85   Pulse 91   Temp 98.5 F (36.9 C) (Oral)  Resp (!) 21   Wt 202 lb 13.2 oz (92 kg)   SpO2 97%   BMI 33.75 kg/m   Physical Exam  Constitutional: He is oriented to person, place, and time. He appears well-developed and well-nourished. No distress.  HENT:  Head: Normocephalic and atraumatic.  Cardiovascular: Normal rate, regular rhythm and normal heart sounds.   No murmur heard. Pulmonary/Chest: Effort normal and breath sounds normal. No respiratory distress. He has no wheezes.  Abdominal: Soft. Bowel sounds are normal. There is no tenderness.  Musculoskeletal: He exhibits no edema.  Neurological: He is alert and oriented to person, place, and time.  Skin: Skin is warm and dry.  Psychiatric: He has a normal  mood and affect.  Anxious appearing  Nursing note and vitals reviewed.    ED Treatments / Results  Labs (all labs ordered are listed, but only abnormal results are displayed) Labs Reviewed  COMPREHENSIVE METABOLIC PANEL - Abnormal; Notable for the following:       Result Value   CO2 21 (*)    Glucose, Bld 124 (*)    AST 190 (*)    ALT 267 (*)    All other components within normal limits  TROPONIN I - Abnormal; Notable for the following:    Troponin I 4.46 (*)    All other components within normal limits  LIPID PANEL - Abnormal; Notable for the following:    Triglycerides 189 (*)    All other components within normal limits  TROPONIN I - Abnormal; Notable for the following:    Troponin I 7.15 (*)    All other components within normal limits  TROPONIN I - Abnormal; Notable for the following:    Troponin I 5.06 (*)    All other components within normal limits  TROPONIN I - Abnormal; Notable for the following:    Troponin I 3.15 (*)    All other components within normal limits  SEDIMENTATION RATE - Abnormal; Notable for the following:    Sed Rate 42 (*)    All other components within normal limits  C-REACTIVE PROTEIN - Abnormal; Notable for the following:    CRP 5.9 (*)    All other components within normal limits  EPSTEIN-BARR VIRUS VCA, IGG - Abnormal; Notable for the following:    EBV VCA IgG 156.0 (*)    All other components within normal limits  GLUCOSE, CAPILLARY - Abnormal; Notable for the following:    Glucose-Capillary 114 (*)    All other components within normal limits  LIPID PANEL - Abnormal; Notable for the following:    HDL 39 (*)    LDL Cholesterol 112 (*)    All other components within normal limits  I-STAT TROPOININ, ED - Abnormal; Notable for the following:    Troponin i, poc 5.03 (*)    All other components within normal limits  CBC WITH DIFFERENTIAL/PLATELET  PROTIME-INR  APTT  RAPID URINE DRUG SCREEN, HOSP PERFORMED  HIV ANTIBODY (ROUTINE  TESTING)  TSH  BRAIN NATRIURETIC PEPTIDE  EPSTEIN-BARR VIRUS VCA, IGM  RSV(RESPIRATORY SYNCYTIAL VIRUS) AB, BLOOD  CMV IGM    EKG  EKG Interpretation  Date/Time:  Wednesday Aug 29 2016 05:23:54 EDT Ventricular Rate:  101 PR Interval:  148 QRS Duration: 92 QT Interval:  348 QTC Calculation: 451 R Axis:   80 Text Interpretation:  Sinus tachycardia diffuse st elevation No significant change since last tracing Confirmed by Jacalyn LefevreHaviland, Julie [phone]([id number]) on 08/31/2016 4:49:49 AM     ED ECG REPORT  Date: 08/29/2016  Rate: 105  Rhythm: sinus tachycardia  QRS Axis: normal  Intervals: normal  ST/T Wave abnormalities: ST elevations inferiorly and ST elevations laterally  Conduction Disutrbances:none  Narrative Interpretation: Consistent with STEMI, acute MI. When compared to prior ST elevations are more prominent greater than 4 mm in inferior leads  Old EKG Reviewed: changes noted  I have personally reviewed the EKG tracing and agree with the computerized printout as noted.   Radiology No results found.  Procedures Procedures (including critical care time)  CRITICAL CARE Performed by: Shon Baton   Total critical care time: 45 minutes  Critical care time was exclusive of separately billable procedures and treating other patients.  Critical care was necessary to treat or prevent imminent or life-threatening deterioration.  Critical care was time spent personally by me on the following activities: development of treatment plan with patient and/or surrogate as well as nursing, discussions with consultants, evaluation of patient's response to treatment, examination of patient, obtaining history from patient or surrogate, ordering and performing treatments and interventions, ordering and review of laboratory studies, ordering and review of radiographic studies, pulse oximetry and re-evaluation of patient's condition.   Medications Ordered in ED Medications  0.9% sodium  chloride infusion (1 mL/kg/hr  93.4 kg Intravenous New Bag/Given 08/29/16 0730)  heparin injection 4,000 Units (4,000 Units Intravenous Given 08/29/16 0548)  morphine 4 MG/ML injection 4 mg (4 mg Intravenous Given 08/29/16 0547)  ondansetron (ZOFRAN) injection 4 mg (4 mg Intravenous Given 08/29/16 0548)  heparin infusion 2 units/mL in 0.9 % sodium chloride (1,000 mLs Other New Bag/Given 08/29/16 0644)     Initial Impression / Assessment and Plan / ED Course  I have reviewed the triage vital signs and the nursing notes.  Pertinent labs & imaging results that were available during my care of the patient were reviewed by me and considered in my medical decision making (see chart for details).     Patient presents with chest pain.  Ongoing for the last 5 hours. He does have risk factors for ACS but also has a history of myocarditis. He's fairly young without any early family history of heart disease. I was able to locate an old EKG. Current EKG shows market ST elevations inferiorly laterally which is changed. Given ongoing pain,STEMI was activated. I discussed the case with Dr. Swaziland. He agrees the patient likely needs cardiac catheterization; however, EKG changes could be related to history of myocarditis. After speaking with Dr. Swaziland, patient's troponin came back at 5.03.  He was given morphine and nitroglycerin for pain. Heparin bolus initiated. He received aspirin by EMS.    Final Clinical Impressions(s) / ED Diagnoses   Final diagnoses:  Precordial pain  Hx of myocarditis    New Prescriptions Discharge Medication List as of 08/30/2016  2:16 PM    START taking these medications   Details  colchicine 0.6 MG tablet Take 1 tablet (0.6 mg total) by mouth 2 (two) times daily., Starting Thu 08/30/2016, Normal    ibuprofen (ADVIL,MOTRIN) 800 MG tablet Take 1 tablet (800 mg total) by mouth 3 (three) times daily with meals., Starting Thu 08/30/2016, Normal         Chaney Ingram, Mayer Masker,  MD 08/29/16 [id number]    Shon Baton, MD 09/01/16 [phone]

## 2016-08-29 NOTE — Progress Notes (Signed)
Progress Note  Patient Name: Ricky Evans Date of Encounter: 08/29/2016  Primary Cardiologist: Dr. Swaziland  Subjective   Pt was seen and admitted by Dr. Swaziland earlier this morning. Dr. Swaziland has personally requested that this patient be followed in morning rounds today, once admitted to the floor.   He is a 26 y/o male with history of myocarditis at age 9 who presented to the ED with acute chest pain. Ecg showed marked ST elevation in the inferior leads up to 4 mm with 2 mm ST elevation in the lateral leads. Troponin in the ED was 2.76. Pt taken to cath lab by Dr. Swaziland and found to have normal coronaries. LVEF mildly reduced at 45-50%. Echo pending. 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   Pt reports he is currently CP free after getting morphine. His parents are by his bedside. He has some anxiety. He denies dyspnea, syncope/ near syncope.   Inpatient Medications    Scheduled Meds: . aspirin  81 mg Oral Daily  . ibuprofen  800 mg Oral TID WC  . pantoprazole  40 mg Oral Daily  . sodium chloride flush  3 mL Intravenous Q12H  . sodium chloride flush  3 mL Intravenous Q12H   Continuous Infusions: . sodium chloride 20 mL/hr at 08/29/16 0548  . sodium chloride    . sodium chloride    . sodium chloride     PRN Meds: sodium chloride, sodium chloride, acetaminophen, nitroGLYCERIN, ondansetron (ZOFRAN) IV, sodium chloride flush, sodium chloride flush   Vital Signs    Vitals:   08/29/16 0642 08/29/16 0647 08/29/16 0652 08/29/16 0715  BP: 113/74   112/75  Pulse: (!) 101 100 96 89  Resp: 15 (!) 32 (!) 24 20  Temp:    98.6 F (37 C)  TempSrc:    Oral  SpO2: 97% 96% (!) 0% 97%   No intake or output data in the 24 hours ending 08/29/16 0919 There were no vitals filed for this visit.  Telemetry    NSR - Personally Reviewed  ECG    Admission EKG>>ST elevation in the inferior leads up to 4 mm with 2 mm ST elevation in the lateral leads -  Personally Reviewed  Physical Exam   GEN: No acute distress.   Neck: No JVD Cardiac: RRR, no murmurs, rubs, or gallops.  Respiratory: Clear to auscultation bilaterally. GI: Soft, nontender, non-distended  MS: No edema; No deformity. Neuro:  Nonfocal  Psych: Normal affect   Labs    Chemistry Recent Labs Lab 08/29/16 0533  NA 139  K 3.6  CL 104  CO2 21*  GLUCOSE 124*  BUN 11  CREATININE 0.71  CALCIUM 9.0  PROT 7.5  ALBUMIN 4.0  AST 190*  ALT 267*  ALKPHOS 62  BILITOT 0.6  GFRNONAA >60  GFRAA >60  ANIONGAP 14     Hematology Recent Labs Lab 08/29/16 0533  WBC 7.9  RBC 4.64  HGB 14.4  HCT 42.7  MCV 92.0  MCH 31.0  MCHC 33.7  RDW 13.4  PLT 250    Cardiac Enzymes Recent Labs Lab 08/29/16 0533  TROPONINI 4.46*    Recent Labs Lab 08/29/16 0531  TROPIPOC 5.03*     BNPNo results for input(s): BNP, PROBNP in the last 168 hours.   DDimer No results for input(s): DDIMER in the last 168 hours.   Radiology    Dg Chest Port 1 View  Result Date: 08/29/2016 CLINICAL DATA:  Chest pain. EXAM: PORTABLE CHEST 1 VIEW COMPARISON:  Frontal and lateral views 03/19/2012 FINDINGS: Heart size at the upper limits of normal likely accentuated by AP technique. Mediastinal contours are normal. No pulmonary edema, pleural fluid, focal airspace disease, or pneumothorax. No acute osseous abnormalities. IMPRESSION: Heart at the upper limits of normal in size likely accentuated by AP technique. No localizing pulmonary abnormality. Electronically Signed   By: Rubye OaksMelanie  Ehinger M.D.   On: 08/29/2016 06:00    Cardiac Studies   LHC 08/29/16 Procedures   Left Heart Cath and Coronary Angiography  Conclusion     There is mild left ventricular systolic dysfunction.  LV end diastolic pressure is mildly elevated.  The left ventricular ejection fraction is 45-50% by visual estimate.   1. Normal coronary anatomy 2. Mild LV dysfunction. Hypokinesis of the mid to distal  inferior wall and apex 3. Mildly elevated LVEDP  Plan: admit telemetry. Anti-inflammatory therapy for acute myopericarditis.     2D Echo- pending    Patient Profile     26 y/o male with history of myocarditis at age 26 who presented to the ED with acute chest pain. Ecg showed marked ST elevation in the inferior leads up to 4 mm with 2 mm ST elevation in the lateral leads. Troponin in the ED was 2.76. Pt taken to cath lab by Dr. SwazilandJordan and found to have normal coronaries. LVEF mildly reduced at 45-50%. He has been admitted for treatment of suspected myopericarditis.   Assessment & Plan    1. Acute Myopericarditis: initial presentation was concerning for ACS, with CP, ST elevations on EKG and troponin level at 2.76>>4.46, however LHC today showed normal coronaries and mildly reduced LVEF at 45-50%.  He has been started on NSAIDS for myopericarditis, which he has a h/o. Continue Ibuprofen, 800 mg TID. ? Addition of colchicine. Will defer to MD. 2D echo pending to r/o pericardial effusion. His radial cath site appears stable. TR band has been removed.  We will continue to monitor.   Signed, Robbie LisBrittainy Simmons, PA-C  08/29/2016, 9:19 AM     .Attending Note:   The patient was seen and examined.  Agree with assessment and plan as noted above.  Changes made to the above note as needed.  Patient seen and independently examined with Robbie LisBrittainy Simmons, PA .   We discussed all aspects of the encounter. I agree with the assessment and plan as stated above.  1. Myopericarditis:   Emergent cath this am shows normal coronaries. Is feeling better on Ibuprofen. Will add Colchicine 0.6 bid  Echo today Probable DC to home tomorrow    I have spent a total of 40 minutes with patient reviewing hospital  notes , telemetry, EKGs, labs and examining patient as well as establishing an assessment and plan that was discussed with the patient. > 50% of time was spent in direct patient care.    Vesta MixerPhilip J.  Alean Kromer, Montez HagemanJr., MD, [facility] County HospitalFACC 08/29/2016, 10:43 AM [address],  [address] Office [phone] Pager [phone]

## 2016-08-29 NOTE — ED Notes (Signed)
Verified Heparin Bolus Dose with Ivory BroadKelly M. RN.

## 2016-08-29 NOTE — H&P (Signed)
Physician History and Physical    Patient ID: Ricky Evans MRN: [id number] DOB/AGE: [birth year] 26 y.o. Admit date: 08/29/2016  Primary Care Physician: Dettinger, Elige RadonJoshua A, MD Primary Cardiologist new  HPI: [age] yo WM with history of myocarditis at age 26 presents to the ED with acute chest pain. Patient states he was in good health until Monday when he felt febrile with sweats and diffuse body aches. Went to work yesterday and did very physical work in the heat and felt terrible all day. At midnight last night he developed midsubsternal chest pain. Some radiation to left arm. Pain constant. No change with deep breathing or position. States pain similar to prior myocarditis in 2009. He does have history of tobacco abuse and HTN (untreated).  Records from 2009 show mild diffuse ST elevation. Troponin went up to 2.76. Echo showed normal LV function. Treated with NSAIDs.  Ecg today shows more marked ST elevation in the inferior leads up to 4 mm with 2 mm ST elevation in the lateral leads.   Review of systems complete and found to be negative unless listed above  Past Medical History:  Diagnosis Date  . HTN (hypertension)   . Myocarditis (HCC)     Family History  Problem Relation Age of Onset  . Hypertension Mother   . Cancer Paternal Grandmother        lung  . Cancer Paternal Grandfather        lung    Social History   Social History  . Marital status: Single    Spouse name: N/A  . Number of children: N/A  . Years of education: N/A   Occupational History  . Grading     Social History Main Topics  . Smoking status: Current Every Day Smoker    Packs/day: 0.50    Types: Cigarettes  . Smokeless tobacco: Never Used  . Alcohol use Yes     Comment: 4 beer, occasional binge  . Drug use: No  . Sexual activity: Yes    Birth control/ protection: IUD     Comment: girlfriend of 2 months,    Other Topics Concern  . Not on file   Social History Narrative  . No narrative on file    History reviewed. No pertinent surgical history.   No prescriptions prior to admission.    Physical Exam: Blood pressure 113/74, pulse 96, temperature 98 F (36.7 C), temperature source Oral, resp. rate (!) 24, SpO2 (!) 0 %.  Current Weight  05/28/16 206 lb (93.4 kg)  04/12/12 190 lb (86.2 kg)  03/19/12 180 lb (81.6 kg)    GENERAL:  Well appearing young WM sweaty and anxious. HEENT:  PERRL, EOMI, sclera are clear. Oropharynx is clear. NECK:  No jugular venous distention, carotid upstroke brisk and symmetric, no bruits, no thyromegaly or adenopathy LUNGS:  Clear to auscultation bilaterally CHEST:  Unremarkable HEART:  RRR,  PMI not displaced or sustained,S1 and S2 within normal limits, no S3, no S4: no clicks, no rubs, no murmurs ABD:  Soft, nontender. BS +, no masses or bruits. No hepatomegaly, no splenomegaly EXT:  2 + pulses throughout, no edema, no cyanosis no clubbing SKIN:  Warm and dry.  No rashes NEURO:  Alert and oriented x 3. Cranial nerves II through XII intact. PSYCH:  Cognitively intact    Labs:   Lab Results  Component Value Date   WBC 7.9 08/29/2016   HGB 14.4 08/29/2016   HCT 42.7 08/29/2016   MCV 92.0 08/29/2016  PLT 250 08/29/2016     Recent Labs Lab 08/29/16 0533  NA 139  K 3.6  CL 104  CO2 21*  BUN 11  CREATININE 0.71  CALCIUM 9.0  PROT 7.5  BILITOT 0.6  ALKPHOS 62  ALT 267*  AST 190*  GLUCOSE 124*   Lab Results  Component Value Date   CKMB 10.2 (H) 03/23/2008   CKMB 23.6 (H) 03/22/2008   TROPONINI 4.46 (HH) 08/29/2016   TROPONINI (HH) 03/23/2008    2.11        POSSIBLE MYOCARDIAL ISCHEMIA. SERIAL TESTING RECOMMENDED. CRITICAL VALUE NOTED.  VALUE IS CONSISTENT WITH PREVIOUSLY REPORTED AND CALLED VALUE.   TROPONINI (HH) 03/22/2008    2.76        POSSIBLE MYOCARDIAL ISCHEMIA. SERIAL TESTING RECOMMENDED. CRITICAL RESULT CALLED TO, READ BACK BY AND VERIFIED WITH: [facility] M,RN 03/22/08 2218 [facility]    Lab Results  Component Value  Date   CHOL 178 08/29/2016   Lab Results  Component Value Date   HDL 45 08/29/2016   Lab Results  Component Value Date   LDLCALC 95 08/29/2016   Lab Results  Component Value Date   TRIG 189 (H) 08/29/2016   Lab Results  Component Value Date   CHOLHDL 4.0 08/29/2016   No results found for: LDLDIRECT  No results found for: PROBNP No results found for: TSH No results found for: HGBA1C  Radiology: Dg Chest Port 1 View  Result Date: 08/29/2016 CLINICAL DATA:  Chest pain. EXAM: PORTABLE CHEST 1 VIEW COMPARISON:  Frontal and lateral views 03/19/2012 FINDINGS: Heart size at the upper limits of normal likely accentuated by AP technique. Mediastinal contours are normal. No pulmonary edema, pleural fluid, focal airspace disease, or pneumothorax. No acute osseous abnormalities. IMPRESSION: Heart at the upper limits of normal in size likely accentuated by AP technique. No localizing pulmonary abnormality. Electronically Signed   By: Rubye Oaks M.D.   On: 08/29/2016 06:00    EKG: NSR with 3-4 mm ST elevation in the inferior leads, 2 mm ST elevation in the lateral leads. ? PR depression. I have personally reviewed and interpreted this study.   ASSESSMENT AND PLAN:  1. Acute chest pain with ST elevation. Suspect acute myopericarditis given prior history. Ecg are more striking compared to 2009 and troponin is elevated. Given history of HTN and tobacco use recommend urgent cardiac cath to rule out CAD. If coronaries are OK will treat with anti-inflammatory agents. Check CRP, sed rate, viral titers. 2. Tobacco abuse- recommend smoking cessation.  Signed: Teyona Nichelson Sw[place], MDFACC  08/29/2016, 7:05 AM

## 2016-08-29 NOTE — ED Triage Notes (Signed)
Patient arrives from home with complaint of nausea and vomiting for 1 day combined with chest pain which started tonight. EMS gave 324mg  ASA and 0.4 mg SL NTG. States onset of chest pain was while working. 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 Since then he hasn't felt right. EMS reported no change in patient with NTG administration. History of "this exact problem when I was 17". States it was myocarditis.

## 2016-08-30 ENCOUNTER — Inpatient Hospital Stay (HOSPITAL_COMMUNITY): Payer: Self-pay

## 2016-08-30 DIAGNOSIS — G4733 Obstructive sleep apnea (adult) (pediatric): Secondary | ICD-10-CM | POA: Diagnosis present

## 2016-08-30 DIAGNOSIS — R079 Chest pain, unspecified: Secondary | ICD-10-CM

## 2016-08-30 LAB — LIPID PANEL
CHOL/HDL RATIO: 4.6 ratio
Cholesterol: 180 mg/dL (ref 0–200)
HDL: 39 mg/dL — AB (ref >40)
LDL CALC: 112 mg/dL — AB (ref 0–99)
Triglycerides: 144 mg/dL (ref <150)
VLDL: 29 mg/dL (ref 0–40)

## 2016-08-30 LAB — ECHOCARDIOGRAM COMPLETE: Weight: 3245.17 oz

## 2016-08-30 LAB — EPSTEIN-BARR VIRUS VCA, IGM: EBV VCA IgM: <36 U/mL (ref 0.0–35.9)

## 2016-08-30 LAB — EPSTEIN-BARR VIRUS VCA, IGG: EBV VCA IGG: 156 U/mL — AB (ref 0.0–17.9)

## 2016-08-30 LAB — CMV IGM: CMV IgM: <30 AU/mL (ref 0.0–29.9)

## 2016-08-30 MED ORDER — COLCHICINE 0.6 MG PO TABS: 0.6000 mg | ORAL_TABLET | Freq: Two times a day (BID) | ORAL | 2 refills | Status: DC

## 2016-08-30 MED ORDER — IBUPROFEN 800 MG PO TABS
800.0000 mg | ORAL_TABLET | Freq: Three times a day (TID) | ORAL | 0 refills | Status: AC
Start: 2016-08-30 — End: ?

## 2016-08-30 NOTE — Progress Notes (Signed)
  Echocardiogram 2D Echocardiogram has been performed.  Ricky Evans, Ricky Evans 08/30/2016, 9:44 AM

## 2016-08-30 NOTE — Progress Notes (Signed)
During this hospital stay, patient has exhibited snoring and sleep apnea.  I suggested extablishing a primary care provider and pursuit of a sleep study.

## 2016-08-30 NOTE — Discharge Summary (Signed)
Discharge Summary    Patient ID: Ricky Evans,  MRN: [id number], DOB/AGE: 05/02/1990 26 y.o.  Admit date: 08/29/2016 Discharge date: 08/30/2016   Primary Care Provider: Patient, No Pcp Per Primary Cardiologist: Dr. SwazilandJordan  Discharge Diagnoses    Principal Problem:   Acute myopericarditis Active Problems:   OSA (obstructive sleep apnea)   Allergies Allergies  Allergen Reactions  . Amoxil [Amoxicillin] Hives     History of Present Illness     The patient is a 26 yo WM with history of myocarditis at age 26 presents to the ED with acute chest pain. Patient stated he was in good health until Monday when he felt febrile with sweats and diffuse body aches. He went to work yesterday and did very physical work in the heat and felt terrible all day. At midnight last night he developed midsubsternal chest pain. Some radiation to left arm. Pain constant. No change with deep breathing or position. States pain similar to prior myocarditis in 2009. He does have history of tobacco abuse and HTN (untreated).  Records from 2009 show mild diffuse ST elevation. Troponin went up to 2.76. Echo showed normal LV function. Treated with NSAIDs.   EKG in clinic on 08/29/16 shows more marked ST elevation in the inferior leads up to 4 mm with 2 mm ST elevation in the lateral lead. He was send [facility] for further evaluation.  Hospital Course     Consultants: none  [facility] and [facility] left heart catheterization. The patient tolerated the procedure well. Right radial site C/D/I without bleeding or bruit. He has ambulated in the halls without assistance.   His heart cath was without obstructive disease. His chest pain is likely due to myopericarditis. Today, he is chest pain free. Troponins have trended down and echocardiogram without abnormalities.  He was discharged on ibuprofen (until pain free) and colchicine (3 months).  Patient seen and examined by Dr. Melburn PopperNasher  today and  was stable for discharge. All follow up has been arranged.  _____________  Discharge Vitals Blood pressure 126/85, pulse 91, temperature 98.5 F (36.9 C), temperature source Oral, resp. rate (!) 21, weight 202 lb 13.2 oz (92 kg), SpO2 97 %.  Filed Weights   08/29/16 0900 08/30/16 0335  Weight: 205 lb 14.6 oz (93.4 kg) 202 lb 13.2 oz (92 kg)    Labs & Radiologic Studies    CBC  Recent Labs  08/29/16 0533  WBC 7.9  NEUTROABS 4.3  HGB 14.4  HCT 42.7  MCV 92.0  PLT 250   Basic Metabolic Panel  Recent Labs  08/29/16 0533  NA 139  K 3.6  CL 104  CO2 21*  GLUCOSE 124*  BUN 11  CREATININE 0.71  CALCIUM 9.0   Liver Function Tests  Recent Labs  08/29/16 0533  AST 190*  ALT 267*  ALKPHOS 62  BILITOT 0.6  PROT 7.5  ALBUMIN 4.0   No results for input(s): LIPASE, AMYLASE in the last 72 hours. Cardiac Enzymes  Recent Labs  08/29/16 0812 08/29/16 1344 08/29/16 1854  TROPONINI 7.15* 5.06* 3.15*   BNP Invalid input(s): POCBNP D-Dimer No results for input(s): DDIMER in the last 72 hours. Hemoglobin A1C No results for input(s): HGBA1C in the last 72 hours. Fasting Lipid Panel  Recent Labs  08/30/16 0329  CHOL 180  HDL 39*  LDLCALC 112*  TRIG 144  CHOLHDL 4.6   Thyroid Function Tests  Recent Labs  08/29/16 [id number]  TSH 1.592   _____________  Dg Chest Port 1 View  Result Date: 08/29/2016 CLINICAL DATA:  Chest pain. EXAM: PORTABLE CHEST 1 VIEW COMPARISON:  Frontal and lateral views 03/19/2012 FINDINGS: Heart size at the upper limits of normal likely accentuated by AP technique. Mediastinal contours are normal. No pulmonary edema, pleural fluid, focal airspace disease, or pneumothorax. No acute osseous abnormalities. IMPRESSION: Heart at the upper limits of normal in size likely accentuated by AP technique. No localizing pulmonary abnormality. Electronically Signed   By: Rubye Oaks M.D.   On: 08/29/2016 06:00     Diagnostic Studies/Procedures     Echocardiogram 08/30/16: Study Conclusions - Left ventricle: The cavity size was normal. Systolic function was   normal. The estimated ejection fraction was in the range of 55%   to 60%. Wall motion was normal; there were no regional wall   motion abnormalities. Left ventricular diastolic function   parameters were normal. - Aortic valve: Trileaflet; normal thickness leaflets. There was   trivial regurgitation. - Aortic root: The aortic root was normal in size. - Mitral valve: Calcified annulus. Mildly thickened leaflets .   There was no regurgitation. - Left atrium: The atrium was mildly dilated. - Right ventricle: The cavity size was normal. Wall thickness was   normal. Systolic function was normal. - Tricuspid valve: There was trivial regurgitation. - Pulmonary arteries: Systolic pressure was within the normal   range. - Inferior vena cava: The vessel was normal in size. - Pericardium, extracardiac: There was no pericardial effusion.  Impressions: - Normal study.   Left heart catheterization 08/29/16:  There is mild left ventricular systolic dysfunction.  LV end diastolic pressure is mildly elevated.  The left ventricular ejection fraction is 45-50% by visual estimate.   1. Normal coronary anatomy 2. Mild LV dysfunction. Hypokinesis of the mid to distal inferior wall and apex 3. Mildly elevated LVEDP  Plan: admit telemetry. Anti-inflammatory therapy for acute myopericarditis.     Disposition   Pt is being discharged home today in good condition.  Follow-up Plans & Appointments    Follow-up Information    [facility] Follow up.   Why:  Call on Tuesday 5/22 at 8:30 to schedule hospital follow up apt and become new patient. Contact information: [address] [facility] [address] [phone]       Ellsworth Lennox, PA-C Follow up on 09/13/2016.   Specialties:  Physician Assistant, Cardiology Why:  9:30  AM for hospital follow up Contact information: [address] [place] [address] [phone]        [facility] [address] Follow up.   Specialty:  Cardiology Why:  Call to arrange sleep study for OSA Contact information: [address], [address] [place] [address] [phone]         Discharge Instructions    Diet - low sodium heart healthy    Complete by:  As directed    Discharge instructions    Complete by:  As directed    No driving for 2 days. No lifting over 5 lbs for 1 week. No sexual activity for 1 week. You may return to work in 1 week. Keep procedure site clean & dry. If you notice increased pain, swelling, bleeding or pus, call/return!  You may shower, but no soaking baths/hot tubs/pools for 1 week.   Increase activity slowly    Complete by:  As directed       Discharge  Medications   Current Discharge Medication List    START taking these medications   Details  colchicine 0.6 MG tablet Take 1 tablet (0.6 mg total) by mouth 2 (two) times daily. Qty: 60 tablet, Refills: 2    ibuprofen (ADVIL,MOTRIN) 800 MG tablet Take 1 tablet (800 mg total) by mouth 3 (three) times daily with meals. Qty: 30 tablet, Refills: 0      CONTINUE these medications which have NOT CHANGED   Details  ranitidine (ZANTAC) 150 MG tablet Take 150 mg by mouth daily.          Outstanding Labs/Studies   Staff message sent by Dr. Elease Hashimoto to setup for sleep study as OP.   Duration of Discharge Encounter   Greater than 30 minutes including physician time.  Signed, Roe Rutherford Duke PA-C 08/30/2016, 2:05 PM  Attending Note:   The patient was seen and examined.  Agree with assessment and plan as noted above.  Changes made to the above note as needed.  Patient seen and independently examined with Consuelo Pandy, PA .   We discussed all aspects of the encounter. I agree with the assessment and plan as stated above.  1.  Myopericarditis:    Ricky Evans is already doing better on Ibuprofen and Colchicine Will continue colchicine for 3 months .    Continue NSAIDS as needed for pain  Will follow up with dr. Swaziland    I have spent a total of 40 minutes with patient reviewing hospital  notes , telemetry, EKGs, labs and examining patient as well as establishing an assessment and plan that was discussed with the patient. > 50% of time was spent in direct patient care.    Vesta Mixer, Montez Hageman., MD, [facility] 08/30/2016, 2:55 PM [address],  [address] Office [phone] Pager [phone]

## 2016-08-30 NOTE — Progress Notes (Signed)
Progress Note  Patient Name: Ricky KicksRoger M Koper Date of Encounter: 08/30/2016  Primary Cardiologist: new - Dr. SwazilandJordan  Subjective    Patient is feeling well; denies chest pain, SOB, and palpitations.  Inpatient Medications    Scheduled Meds: . aspirin  81 mg Oral Daily  . colchicine  0.6 mg Oral BID  . ibuprofen  800 mg Oral TID WC  . pantoprazole  40 mg Oral Daily  . sodium chloride flush  3 mL Intravenous Q12H  . sodium chloride flush  3 mL Intravenous Q12H   Continuous Infusions: . sodium chloride 20 mL/hr at 08/29/16 0548  . sodium chloride    . sodium chloride     PRN Meds: sodium chloride, sodium chloride, acetaminophen, nitroGLYCERIN, ondansetron (ZOFRAN) IV, sodium chloride flush, sodium chloride flush   Vital Signs    Vitals:   08/29/16 1700 08/29/16 1937 08/29/16 2000 08/30/16 0335  BP: 119/80 114/80  110/60  Pulse: 87 88  85  Resp: 20 12 13 10   Temp:  98.2 F (36.8 C)  98.2 F (36.8 C)  TempSrc:  Oral  Oral  SpO2: 99% 99%  98%  Weight:    202 lb 13.2 oz (92 kg)    Intake/Output Summary (Last 24 hours) at 08/30/16 0744 Last data filed at 08/30/16 0352  Gross per 24 hour  Intake           1880.4 ml  Output             1300 ml  Net            580.4 ml   Filed Weights   08/29/16 0900 08/30/16 0335  Weight: 205 lb 14.6 oz (93.4 kg) 202 lb 13.2 oz (92 kg)     Physical Exam   General: Well developed, well nourished, male appearing in no acute distress. Head: Normocephalic, atraumatic.  Neck: Supple without bruits, no JVD Lungs:  Resp regular and unlabored, CTA. Heart: RRR, S1, S2, no murmur Abdomen: Soft, non-tender, non-distended with normoactive bowel sounds. No hepatomegaly. No rebound/guarding. No obvious abdominal masses. Extremities: No clubbing, cyanosis, no edema. Distal pedal pulses are 1+ bilaterally. Right radial cath site C/D/I, no signs of bleeding. Neuro: Alert and oriented X 3. Moves all extremities spontaneously. Psych: Normal  affect.  Labs    Chemistry Recent Labs Lab 08/29/16 0533  NA 139  K 3.6  CL 104  CO2 21*  GLUCOSE 124*  BUN 11  CREATININE 0.71  CALCIUM 9.0  PROT 7.5  ALBUMIN 4.0  AST 190*  ALT 267*  ALKPHOS 62  BILITOT 0.6  GFRNONAA >60  GFRAA >60  ANIONGAP 14     Hematology Recent Labs Lab 08/29/16 0533  WBC 7.9  RBC 4.64  HGB 14.4  HCT 42.7  MCV 92.0  MCH 31.0  MCHC 33.7  RDW 13.4  PLT 250    Cardiac Enzymes Recent Labs Lab 08/29/16 0533 08/29/16 0812 08/29/16 1344 08/29/16 1854  TROPONINI 4.46* 7.15* 5.06* 3.15*    Recent Labs Lab 08/29/16 0531  TROPIPOC 5.03*     BNP Recent Labs Lab 08/29/16 0812  BNP 44.9     DDimer No results for input(s): DDIMER in the last 168 hours.   Radiology    Dg Chest Port 1 View  Result Date: 08/29/2016 CLINICAL DATA:  Chest pain. EXAM: PORTABLE CHEST 1 VIEW COMPARISON:  Frontal and lateral views 03/19/2012 FINDINGS: Heart size at the upper limits of normal likely accentuated by AP technique. Mediastinal  contours are normal. No pulmonary edema, pleural fluid, focal airspace disease, or pneumothorax. No acute osseous abnormalities. IMPRESSION: Heart at the upper limits of normal in size likely accentuated by AP technique. No localizing pulmonary abnormality. Electronically Signed   By: Rubye Oaks M.D.   On: 08/29/2016 06:00     Telemetry    NSR - Personally Reviewed  ECG    08/30/16 sinus rhythm with nonspecific ST-T changes - Personally Reviewed   Cardiac Studies   Echocardiogram 08/30/16: pending  Left heart catheterization 08/29/16:  There is mild left ventricular systolic dysfunction.  LV end diastolic pressure is mildly elevated.  The left ventricular ejection fraction is 45-50% by visual estimate.   1. Normal coronary anatomy 2. Mild LV dysfunction. Hypokinesis of the mid to distal inferior wall and apex 3. Mildly elevated LVEDP  Plan: admit telemetry. Anti-inflammatory therapy for acute  myopericarditis.  Patient Profile     26 y.o. male with history of myocarditis at age 33 who presented to the ED with acute chest pain. EKG showed marked ST elevation in the inferior leads up to 4 mm with 2 mm ST elevation in the lateral leads. Troponin in the ED was 2.76. Pt taken to cath lab by Dr. Swaziland and found to have normal coronaries. LVEF mildly reduced at 45-50%. 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   Assessment & Plan    1. Chest pain Left heart cath yesterday with no obstructive disease. EKG has normalized. Troponin is trending down: 4.46 -> 7.15 -> 5.06 -> 3.15. Pt states he is chest-pain free today. Echocardiogram pending. Chest pain is likely related to myocarditis. Plan to continue ibuprofen.       Signed, Marcelino Duster , PA-C 7:44 AM 08/30/2016 Pager: [phone]   Attending Note:   The patient was seen and examined.  Agree with assessment and plan as noted above.  Changes made to the above note as needed.  Patient seen and independently examined with Bettina Gavia, PA .   We discussed all aspects of the encounter. I agree with the assessment and plan as stated above.  1. Acute myopericarditis: Doing well Troponin is trending down Echo being done now  Likely DC later today    2. OSA :   Clinically has OSA . Will arrange for him to get follow up with one of our sleep apnea doctors     I have spent a total of 40 minutes with patient reviewing hospital  notes , telemetry, EKGs, labs and examining patient as well as establishing an assessment and plan that was discussed with the patient. > 50% of time was spent in direct patient care.    Vesta Mixer, Montez Hageman., MD, [facility] 08/30/2016, 9:01 AM [address],  [address] Office [phone] Pager [phone]

## 2016-08-31 ENCOUNTER — Telehealth: Payer: Self-pay | Admitting: *Deleted

## 2016-08-31 DIAGNOSIS — G4733 Obstructive sleep apnea (adult) (pediatric): Secondary | ICD-10-CM

## 2016-08-31 LAB — RSV(RESPIRATORY SYNCYTIAL VIRUS) AB, BLOOD: RSV Ab: NEGATIVE

## 2016-08-31 MED FILL — COLCHICINE 0.6 MG TABLET: 0.6 | 30 days supply | Qty: 60 | Fill #0

## 2016-08-31 NOTE — Telephone Encounter (Signed)
Per Dr Elease Hashimotonahser split night sleep study ordered and follow up with Dr Mayford Knifeurner

## 2016-09-03 NOTE — Telephone Encounter (Signed)
Called patient to inform him about his sleep study scheduled for Monday November 05 2016 Informed patient of upcoming home sleep study and patient understanding was verbalized. Patient [facility] Patient understands he will receive a sleep packet in a week or so. Patient understands to call if he does not receive the sleep packet in a timely manner. Patient agrees with treatment and thanked me for call.  Patient explained he does not have insurance to cover the cost of the sleep study and wonders if there is any assistance to cover the cost. I told the patient I would check with our pre-cert department and reach back out to him.

## 2016-09-04 NOTE — Telephone Encounter (Signed)
I do not have any information about patient assistance

## 2016-09-12 NOTE — Progress Notes (Signed)
Cardiology Office Note    Date:  09/13/2016   ID:  Ricky Evans, DOB 1990-07-26, MRN [id number]  PCP:  Patient, No Pcp Per  Cardiologist: Dr. SwazilandJordan  Chief Complaint  Patient presents with  . Follow-up    recent hospitalization    History of Present Illness:    Ricky Evans is a 26 y.o. male with past medical history of myocarditis and tobacco use who presents to the office today for hospital follow-up.   He was recently admitted from 5/16 - 08/30/2016 for evaluation of diaphoresis and body aches, reporting his symptoms were similar to his prior episode of myocarditis in 2009. Troponin values peaked at 7.15 and his EKG showed ST elevation along the inferior and lateral leads, therefore he was taken for an urgent cardiac catheterization. This was performed and showed normal cors with mild hypokinesis and an EF of 45-50%. Sed rate was elevated to 42 with a CRP of 5.9. He was started on treatment for Myopericarditis with Ibuprofen 800mg  TID and Colchicine 0.6mg  BID. Echo showed a preserved EF of 55-60% with no regional WMA.  He will continue on Ibuprofen for 2 weeks and Colchicine for 3 months.   In talking with the patient today, he denies any repeat episodes of chest discomfort since his recent hospitalization. 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  No complications concerning   He denies any recent orthopnea, PND, lower extremity edema, palpitations, lightheadedness, dizziness, or presyncope.  He reports good compliance with Colchicine. Has completed his course of Ibuprofen. He is asking if he can resume taking Nexium for acid reflux as Zantac is providing minimal relief.   Past Medical History:  Diagnosis Date  . Anxiety   . GERD (gastroesophageal reflux disease)   . HTN (hypertension)   . Myocarditis (HCC)    a. initially occuring in 2009 b. repeat event in 08/2016 --> EKG  showing ST elevation with elevated troponin. Normal cors by cath. Inflammatory markers evelated.  --> consistent with Myopericardits.   . Pneumonia X 1    Past Surgical History:  Procedure Laterality Date  . CARDIAC CATHETERIZATION  08/29/2016   "clean"  . LEFT HEART CATH AND CORONARY ANGIOGRAPHY N/A 08/29/2016   Procedure: Left Heart Cath and Coronary Angiography;  Surgeon: SwazilandJordan, Peter M, MD;  [facility];  Service: Cardiovascular;  Laterality: N/A;    Current Medications: Outpatient Medications Prior to Visit  Medication Sig Dispense Refill  . ibuprofen (ADVIL,MOTRIN) 800 MG tablet Take 1 tablet (800 mg total) by mouth 3 (three) times daily with meals. 30 tablet 0  . ranitidine (ZANTAC) 150 MG tablet Take 150 mg by mouth daily.    . colchicine 0.6 MG tablet Take 1 tablet (0.6 mg total) by mouth 2 (two) times daily. 60 tablet 2   No facility-administered medications prior to visit.      Allergies:   Amoxil [amoxicillin]   Social History   Social History  . Marital status: Single    Spouse name: N/A  . Number of children: N/A  . Years of education: N/A   Occupational History  . Grading     Social History Main Topics  . Smoking status: Current Every Day Smoker    Packs/day: 0.50    Years: 6.00    Types: Cigarettes  . Smokeless tobacco: Never Used  . Alcohol use 25.2 oz/week  42 Cans of beer per week     Comment: 08/29/2016 "@ least 6 plus beers/day"  . Drug use: No  . Sexual activity: Yes    Birth control/ protection: IUD     Comment: girlfriend of 2 months,    Other Topics Concern  . None   Social History Narrative  . None     Family History:  The patient's family history includes Cancer in his paternal grandfather and paternal grandmother; Hypertension in his mother.   Review of Systems:   Please see the history of present illness.     General:  No chills, fever, night sweats or weight changes.  Cardiovascular:  No chest pain, dyspnea  on exertion, edema, orthopnea, palpitations, paroxysmal nocturnal dyspnea. Dermatological: No rash, lesions/masses Respiratory: No cough, dyspnea Urologic: No hematuria, dysuria Abdominal:   No nausea, vomiting, diarrhea, bright red blood per rectum, melena, or hematemesis. Positive for acid reflux.  Neurologic:  No visual changes, wkns, changes in mental status. All other systems reviewed and are otherwise negative except as noted above.   Physical Exam:    VS:  BP 114/90   Pulse 90   Ht 5\' 5"  (1.651 m)   Wt 217 lb (98.4 kg)   BMI 36.11 kg/m    General: Well developed, well nourished Caucasian male appearing in no acute distress. Head: Normocephalic, atraumatic, sclera non-icteric, no xanthomas, nares are without discharge.  Neck: No carotid bruits. JVD not elevated.  Lungs: Respirations regular and unlabored, without wheezes or rales.  Heart: Regular rate and rhythm. No S3 or S4.  No murmur or gallops appreciated. No friction rub appreciated.  Abdomen: Soft, non-tender, non-distended with normoactive bowel sounds. No hepatomegaly. No rebound/guarding. No obvious abdominal masses. Msk:  Strength and tone appear normal for age. No joint deformities or effusions. Extremities: No clubbing or cyanosis. No lower extremity edema.  Distal pedal pulses are 2+ bilaterally. Neuro: Alert and oriented X 3. Moves all extremities spontaneously. No focal deficits noted. Psych:  Responds to questions appropriately with a normal affect. Skin: No rashes or lesions noted  Wt Readings from Last 3 Encounters:  09/13/16 217 lb (98.4 kg)  08/30/16 202 lb 13.2 oz (92 kg)  05/28/16 206 lb (93.4 kg)     Studies/Labs Reviewed:   EKG:  EKG is not ordered today.  Recent Labs: 08/29/2016: ALT 267; B Natriuretic Peptide 44.9; BUN 11; Creatinine, Ser 0.71; Hemoglobin 14.4; Platelets 250; Potassium 3.6; Sodium 139; TSH 1.592   Lipid Panel    Component Value Date/Time   CHOL 180 08/30/2016 0329   TRIG  144 08/30/2016 0329   HDL 39 (L) 08/30/2016 0329   CHOLHDL 4.6 08/30/2016 0329   VLDL 29 08/30/2016 0329   LDLCALC 112 (H) 08/30/2016 0329    Additional studies/ records that were reviewed today include:   Cardiac Catheterization: 08/29/2016  There is mild left ventricular systolic dysfunction.  LV end diastolic pressure is mildly elevated.  The left ventricular ejection fraction is 45-50% by visual estimate.   1. Normal coronary anatomy 2. Mild LV dysfunction. Hypokinesis of the mid to distal inferior wall and apex 3. Mildly elevated LVEDP  Plan: admit telemetry. Anti-inflammatory therapy for acute myopericarditis.   Echocardiogram: 08/30/2016 Study Conclusions  - Left ventricle: The cavity size was normal. Systolic function was   normal. The estimated ejection fraction was in the range of 55%   to 60%. Wall motion was normal; there were no regional wall   motion abnormalities. Left ventricular diastolic  function   parameters were normal. - Aortic valve: Trileaflet; normal thickness leaflets. There was   trivial regurgitation. - Aortic root: The aortic root was normal in size. - Mitral valve: Calcified annulus. Mildly thickened leaflets .   There was no regurgitation. - Left atrium: The atrium was mildly dilated. - Right ventricle: The cavity size was normal. Wall thickness was   normal. Systolic function was normal. - Tricuspid valve: There was trivial regurgitation. - Pulmonary arteries: Systolic pressure was within the normal   range. - Inferior vena cava: The vessel was normal in size. - Pericardium, extracardiac: There was no pericardial effusion.  Impressions:  - Normal study.  Assessment:    1. Acute myopericarditis   2. OSA (obstructive sleep apnea)   3. Tobacco use      Plan:   In order of problems listed above:  1. Acute Myopericarditis - the patient has a known history of pericarditis, initially occurring in 2009. Evaluated at [facility]  earlier this month for chest pain in the setting of a recent viral illness. Was found to have diffuse ST elevation with troponin elevation up to 7.15. Cath showed normal cors with mild hypokinesis and an EF of 45-50%. Echo showed a preserved EF of 55-60% with no evidence of a pericardial effusion. Sed rate was elevated to 42 with a CRP of 5.9.  - he denies any repeat episodes of chest pain since hospital discharge. No dyspnea with exertion, orthopnea, or PND. He has finished his course of Ibuprofen.  - will continue with Colchicine for a total duration of 3 months. Will not repeat an echocardiogram at this time as his most recent study showed no evidence of an effusion and a friction rub is not appreciated on examination.   2. Possible OSA - a sleep study was arranged while he was in the hospital. Scheduled for 11/05/2016.  3. Tobacco Use - cessation advised.   Medication Adjustments/Labs and Tests Ordered: Current medicines are reviewed at length with the patient today.  Concerns regarding medicines are outlined above.  Medication changes, Labs and Tests ordered today are listed in the Patient Instructions below. Patient Instructions  Medication Instructions:  Your physician recommends that you continue on your current medications as directed. Please refer to the Current Medication list given to you today.  If you need a refill on your cardiac medications before your next appointment, please call your pharmacy.  Follow-Up: Your physician wants you to follow-up in: 6 months with DR Swaziland. You will receive a reminder letter in the mail two months in advance. If you don't receive a letter, please call our office October 2018 to schedule the December 2018 follow-up appointment.  Signed, Ellsworth Lennox, PA-C  09/13/2016 1:14 PM    [facility] [address] [place], [address] [place], [place]  [address] Phone: [phone]; Fax: [phone]  [address]  [place], [place] [address] Phone: [phone]

## 2016-09-13 ENCOUNTER — Encounter: Payer: Self-pay | Admitting: Internal Medicine

## 2016-09-13 ENCOUNTER — Ambulatory Visit (INDEPENDENT_AMBULATORY_CARE_PROVIDER_SITE_OTHER): Payer: Self-pay | Admitting: Student

## 2016-09-13 ENCOUNTER — Encounter: Payer: Self-pay | Admitting: Student

## 2016-09-13 ENCOUNTER — Ambulatory Visit: Payer: Self-pay | Attending: Internal Medicine | Admitting: Internal Medicine

## 2016-09-13 VITALS — BP 116/78 | HR 118 | Temp 98.5°F | Resp 20 | Ht 69.0 in | Wt 217.4 lb

## 2016-09-13 VITALS — BP 114/90 | HR 90 | Ht 65.0 in | Wt 217.0 lb

## 2016-09-13 DIAGNOSIS — IMO0002 Reserved for concepts with insufficient information to code with codable children: Secondary | ICD-10-CM

## 2016-09-13 DIAGNOSIS — G4733 Obstructive sleep apnea (adult) (pediatric): Secondary | ICD-10-CM

## 2016-09-13 DIAGNOSIS — F17210 Nicotine dependence, cigarettes, uncomplicated: Secondary | ICD-10-CM | POA: Insufficient documentation

## 2016-09-13 DIAGNOSIS — Z720 Tobacco use: Secondary | ICD-10-CM

## 2016-09-13 DIAGNOSIS — Z8249 Family history of ischemic heart disease and other diseases of the circulatory system: Secondary | ICD-10-CM | POA: Insufficient documentation

## 2016-09-13 DIAGNOSIS — I309 Acute pericarditis, unspecified: Secondary | ICD-10-CM

## 2016-09-13 DIAGNOSIS — F1099 Alcohol use, unspecified with unspecified alcohol-induced disorder: Secondary | ICD-10-CM

## 2016-09-13 DIAGNOSIS — Z801 Family history of malignant neoplasm of trachea, bronchus and lung: Secondary | ICD-10-CM | POA: Insufficient documentation

## 2016-09-13 DIAGNOSIS — F1010 Alcohol abuse, uncomplicated: Secondary | ICD-10-CM | POA: Insufficient documentation

## 2016-09-13 DIAGNOSIS — Z880 Allergy status to penicillin: Secondary | ICD-10-CM | POA: Insufficient documentation

## 2016-09-13 DIAGNOSIS — R0683 Snoring: Secondary | ICD-10-CM | POA: Insufficient documentation

## 2016-09-13 DIAGNOSIS — I319 Disease of pericardium, unspecified: Secondary | ICD-10-CM | POA: Insufficient documentation

## 2016-09-13 DIAGNOSIS — R0681 Apnea, not elsewhere classified: Secondary | ICD-10-CM | POA: Insufficient documentation

## 2016-09-13 MED ORDER — COLCHICINE 0.6 MG PO TABS: 0.6000 mg | ORAL_TABLET | Freq: Two times a day (BID) | ORAL | 1 refills | Status: DC

## 2016-09-13 MED ORDER — COLCHICINE 0.6 MG PO TABS: 0.6000 mg | ORAL_TABLET | Freq: Two times a day (BID) | ORAL | 1 refills | Status: AC

## 2016-09-13 NOTE — Progress Notes (Signed)
Patient ID: Cecilie KicksRoger M Mckendree, male    DOB: [birth year]  MRN: [id number]  CC: Hospitalization Follow-up (Myopericarditis)   Subjective: Glennon HamiltonRoger Michelotti is a 26 y.o. male who presents for hosp f/u as new pt. His concerns today include:  Pt with hx of myocarditis, and tobacco dependence who [facility] with [facility] CP. EKG revealed acute ST elevations in the inferior and lateral leads.  Patient was taken to the Cath Lab and found to have clean coronaries.  Patient was then started on ibuprofen and colchicine for pericarditis. Patient was also referred for sleep study because of reported loud snoring with apnea episodes.   1.  Myopericarditis: Patient seen by cardiology today in follow-up. He is doing well. No further chest pains. He completed 2 weeks of ibuprofen and was told to continue the colchicine for a total of 3 months. -Would like to get the prescription for [facility]chicine [facility].  2.  Tobacco dependence:  Smoked a pack a day for several years. -States he has cut back to half a pack a day since recent hospitalization. However he is not wanting to quit at this time.  3.  Admits to drinking 12 pack of beer a day plus several shots of liquor prior to hospitalization. Since then he has drank only a few times but is vague about the amount.  4.  Patient endorses a history of loud snoring and apneic episodes during sleep. He was called by the sleep lab a few days ago but states this conversation ended abruptly when he told them that he did not have insurance. Patient Active Problem List   Diagnosis Date Noted  . OSA (obstructive sleep apnea) 08/30/2016  . Acute myopericarditis 08/29/2016     Current Outpatient Prescriptions on File Prior to Visit  Medication Sig Dispense Refill  . ibuprofen (ADVIL,MOTRIN) 800 MG tablet Take 1 tablet (800 mg total) by mouth 3 (three) times daily with meals. 30 tablet 0  . ranitidine (ZANTAC) 150 MG tablet Take 150 mg by mouth  daily.     No current facility-administered medications on file prior to visit.     Allergies  Allergen Reactions  . Amoxil [Amoxicillin] Hives    Social History   Social History  . Marital status: Single    Spouse name: N/A  . Number of children: N/A  . Years of education: N/A   Occupational History  . Grading     Social History Main Topics  . Smoking status: Current Every Day Smoker    Packs/day: 0.50    Years: 6.00    Types: Cigarettes  . Smokeless tobacco: Current User  . Alcohol use 25.2 oz/week    42 Cans of beer per week     Comment: 08/29/2016 "@ least 6 plus beers/day"  . Drug use: No  . Sexual activity: Yes    Birth control/ protection: IUD     Comment: girlfriend of 2 months,    Other Topics Concern  . Not on file   Social History Narrative  . No narrative on file    Family History  Problem Relation Age of Onset  . Hypertension Mother   . Cancer Paternal Grandmother        lung  . Cancer Paternal Grandfather        lung    Past Surgical History:  Procedure Laterality Date  . CARDIAC CATHETERIZATION  08/29/2016   "clean"  . LEFT HEART CATH AND CORONARY ANGIOGRAPHY  N/A 08/29/2016   Procedure: Left Heart Cath and Coronary Angiography;  Surgeon: Swaziland, Peter M, MD;  Location: [facility] INVASIVE CV LAB;  Service: Cardiovascular;  Laterality: N/A;    ROS: Review of Systems  Constitutional: Negative for activity change, fatigue and fever.  Respiratory: Negative for shortness of breath.   Cardiovascular: Negative for chest pain.  Gastrointestinal: Negative for nausea and vomiting.   As stated above  PHYSICAL EXAM: BP 116/78 (BP Location: Left Arm, Patient Position: Sitting, Cuff Size: Large)   Pulse (!) 118   Temp 98.5 F (36.9 C) (Oral)   Resp 20   Ht 5\' 9"  (1.753 m) Comment: w/shoes  Wt 217 lb 6.4 oz (98.6 kg) Comment: w/shoes  SpO2 96%   BMI 32.10 kg/m   Physical Exam  General appearance - alert, well appearing, young caucasian male and  in no distress Mental status - alert, oriented to person, place, and time, normal mood, behavior, speech, dress, motor activity, and thought processes Mouth - mucous membranes moist, pharynx normal without lesions Neck - supple, no significant adenopathy Chest - clear to auscultation, no wheezes, rales or rhonchi, symmetric air entry Heart - normal rate, regular rhythm, normal S1, S2, no murmurs, rubs, clicks or gallops Extremities - peripheral pulses normal, no pedal edema, no clubbing or cyanosis   ASSESSMENT AND PLAN: 1. Myopericarditis Patient to complete 3 months total of colchicine. Advised compliance. Condition can recur - colchicine 0.6 MG tablet; Take 1 tablet (0.6 mg total) by mouth 2 (two) times daily.  Dispense: 60 tablet; Refill: 1  2. Tobacco abuse Patient advised to quit smoking. Discussed health risks associated with smoking including lung and other types of cancers, chronic lung diseases and CV risks.. Pt not ready to give trail of quitting.     3. Alcohol use disorder (HCC) -Commended him on cutting back and and went over how much is too much alcohol. Patient lacks insight into the amount that he was drinking being classified as alcoholism  4. Episode of apnea -We'll have him meet with our financial counselor to sign up for the [facility] card. - Nocturnal polysomnography (NPSG); Future  5. Habitual snoring - Nocturnal polysomnography (NPSG); Future  Patient was given the opportunity to ask questions.  Patient verbalized understanding of the plan and was able to repeat key elements of the plan.   Orders Placed This Encounter  Procedures  . Nocturnal polysomnography (NPSG)     Requested Prescriptions   Signed Prescriptions Disp Refills  . colchicine 0.6 MG tablet 60 tablet 1    Sig: Take 1 tablet (0.6 mg total) by mouth 2 (two) times daily.    Return if symptoms worsen or fail to improve.  Jonah Blue, MD, FACP

## 2016-09-13 NOTE — Patient Instructions (Signed)
Give appointment with Ms. Boyd to sign up for [facility] card.  Continue to work on cutting back on smoking cigarettes. Continue Colchicine for a total of 3 months as recommended by cardiology.   Alcohol Use Disorder Alcohol use disorder is when your drinking disrupts your daily life. When you have this condition, you drink too much alcohol and you cannot control your drinking. Alcohol use disorder can cause serious problems with your physical health. It can affect your brain, heart, liver, pancreas, immune system, stomach, and intestines. Alcohol use disorder can increase your risk for certain cancers and cause problems with your mental health, such as depression, anxiety, psychosis, delirium, and dementia. People with this disorder risk hurting themselves and others. What are the causes? This condition is caused by drinking too much alcohol over time. It is not caused by drinking too much alcohol only one or two times. Some people with this condition drink alcohol to cope with or escape from negative life events. Others drink to relieve pain or symptoms of mental illness. What increases the risk? You are more likely to develop this condition if:  You have a family history of alcohol use disorder.  Your culture encourages drinking to the point of intoxication, or makes alcohol easy to get.  You had a mood or conduct disorder in childhood.  You have been a victim of abuse.  You are an adolescent and: ? You have poor grades or difficulties in school. ? Your caregivers do not talk to you about saying no to alcohol, or supervise your activities. ? You are impulsive or you have trouble with self-control.  What are the signs or symptoms? Symptoms of this condition include:  Drinkingmore than you want to.  Drinking for longer than you want to.  Trying several times to drink less or to control your drinking.  Spending a lot of time getting alcohol, drinking, or recovering from  drinking.  Craving alcohol.  Having problems at work, at school, or at home due to drinking.  Having problems in relationships due to drinking.  Drinking when it is dangerous to drink, such as before driving a car.  Continuing to drink even though you know you might have a physical or mental problem related to drinking.  Needing more and more alcohol to get the same effect you want from the alcohol (building up tolerance).  Having symptoms of withdrawal when you stop drinking. Symptoms of withdrawal include: ? Fatigue. ? Nightmares. ? Trouble sleeping. ? Depression. ? Anxiety. ? Fever. ? Seizures. ? Severe confusion. ? Feeling or seeing things that are not there (hallucinations). ? Tremors. ? Rapid heart rate. ? Rapid breathing. ? High blood pressure.  Drinking to avoid symptoms of withdrawal.  How is this diagnosed? This condition is diagnosed with an assessment. Your health care provider may start the assessment by asking three or four questions about your drinking. Your health care provider may perform a physical exam or do lab tests to see if you have physical problems resulting from alcohol use. She or he may refer you to a mental health professional for evaluation. How is this treated? Some people with alcohol use disorder are able to reduce their alcohol use to low-risk levels. Others need to completely quit drinking alcohol. When necessary, mental health professionals with specialized training in substance use treatment can help. Your health care provider can help you decide how severe your alcohol use disorder is and what type of treatment you need. The following forms of treatment  are available:  Detoxification. Detoxification involves quitting drinking and using prescription medicines within the first week to help lessen withdrawal symptoms. This treatment is important for people who have had withdrawal symptoms before and for heavy drinkers who are likely to have  withdrawal symptoms. Alcohol withdrawal can be dangerous, and in severe cases, it can cause death. Detoxification may be provided in a home, community, or primary care setting, or in a hospital or substance use treatment facility.  Counseling. This treatment is also called talk therapy. It is provided by substance use treatment counselors. A counselor can address the reasons you use alcohol and suggest ways to keep you from drinking again or to prevent problem drinking. The goals of talk therapy are to: ? Find healthy activities and ways for you to cope with stress. ? Identify and avoid the things that trigger your alcohol use. ? Help you learn how to handle cravings.  Medicines.Medicines can help treat alcohol use disorder by: ? Decreasing alcohol cravings. ? Decreasing the positive feeling you have when you drink alcohol. ? Causing an uncomfortable physical reaction when you drink alcohol (aversion therapy).  Support groups. Support groups are led by people who have quit drinking. They provide emotional support, advice, and guidance.  These forms of treatment are often combined. Some people with this condition benefit from a combination of treatments provided by specialized substance use treatment centers. Follow these instructions at home:  Take over-the-counter and prescription medicines only as told by your health care provider.  Check with your health care provider before starting any new medicines.  Ask friends and family members not to offer you alcohol.  Avoid situations where alcohol is served, including gatherings where others are drinking alcohol.  Create a plan for what to do when you are tempted to use alcohol.  Find hobbies or activities that you enjoy that do not include alcohol.  Keep all follow-up visits as told by your health care provider. This is important. How is this prevented?  If you drink, limit alcohol intake to no more than 1 drink a day for nonpregnant  women and 2 drinks a day for men. One drink equals 12 oz of beer, 5 oz of wine, or 1 oz of hard liquor.  If you have a mental health condition, get treatment and support.  Do not give alcohol to adolescents.  If you are an adolescent: ? Do not drink alcohol. ? Do not be afraid to say no if someone offers you alcohol. Speak up about why you do not want to drink. You can be a positive role model for your friends and set a good example for those around you by not drinking alcohol. ? If your friends drink, spend time with others who do not drink alcohol. Make new friends who do not use alcohol. ? Find healthy ways to manage stress and emotions, such as meditation or deep breathing, exercise, spending time in nature, listening to music, or talking with a trusted friend or family member. Contact a health care provider if:  You are not able to take your medicines as told.  Your symptoms get worse.  You return to drinking alcohol (relapse) and your symptoms get worse. Get help right away if:  You have thoughts about hurting yourself or others. If you ever feel like you may hurt yourself or others, or have thoughts about taking your own life, get help right away. You can go to your nearest emergency department or call:  Your local emergency  services (911 in the [place]  A suicide crisis helpline, such as the National Suicide Prevention Lifeline at 309-798-1728. This is open 24 hours a day.  Summary  Alcohol use disorder is when your drinking disrupts your daily life. When you have this condition, you drink too much alcohol and you cannot control your drinking.  Treatment may include detoxification, counseling, medicine, and support groups.  Ask friends and family members not to offer you alcohol. Avoid situations where alcohol is served.  Get help right away if you have thoughts about hurting yourself or others. This information is not intended to replace advice given to you by your  health care provider. Make sure you discuss any questions you have with your health care provider. Document Released: 05/10/2004 Document Revised: 12/29/2015 Document Reviewed: 12/29/2015 Elsevier Interactive Patient Education  [employer].

## 2016-09-13 NOTE — Patient Instructions (Signed)
Medication Instructions:  Your physician recommends that you continue on your current medications as directed. Please refer to the Current Medication list given to you today.  If you need a refill on your cardiac medications before your next appointment, please call your pharmacy.  Follow-Up: Your physician wants you to follow-up in: 6 months with DR SwazilandJORDAN. You will receive a reminder letter in the mail two months in advance. If you don't receive a letter, please call our office October 2018 to schedule the December 2018 follow-up appointment.   Thank you for choosing [facility] at [facility]!!

## 2016-09-13 NOTE — Telephone Encounter (Signed)
Spoke to the patient and informed him that I spoke to the sleep lab and they informed me that [name] has a [facility] assistance program that will pay a percentage of the sleep study maybe all of the cost or set-up payment arrangements based on what the patient can pay.  Gave patient the number to [facility] [phone]) and told him to ask for patient assistance. Patient verbalized understanding and thanked me for call.

## 2016-09-18 ENCOUNTER — Ambulatory Visit: Payer: Self-pay | Attending: Internal Medicine

## 2016-11-05 ENCOUNTER — Ambulatory Visit (HOSPITAL_BASED_OUTPATIENT_CLINIC_OR_DEPARTMENT_OTHER): Payer: Self-pay | Attending: Cardiovascular Disease | Admitting: Cardiovascular Disease

## 2016-11-05 VITALS — Ht 66.0 in | Wt 210.0 lb

## 2016-11-05 DIAGNOSIS — G4733 Obstructive sleep apnea (adult) (pediatric): Secondary | ICD-10-CM | POA: Insufficient documentation

## 2016-12-07 NOTE — Procedures (Signed)
Patient Name: Ricky Evans, Ricky Evans Date: 11/05/2016 Gender: Male D.O.B: [birth year] Age (years): 26 Referring Provider: Mertie Moores Height (inches): 56 Interpreting Physician: Shelva Majestic MD, ABSM Weight (lbs): 210 RPSGT: Jorge Ny BMI: 47 MRN: [id number] Neck Size: 16.50  CLINICAL INFORMATION Sleep Study Type: Split Night CPAP  Indication for sleep study: OSA  Epworth Sleepiness Score: 10  SLEEP STUDY TECHNIQUE As per the AASM Manual for the Scoring of Sleep and Associated Events v2.3 (April 2016) with a hypopnea requiring 4% desaturations.  The channels recorded and monitored were frontal, central and occipital EEG, electrooculogram (EOG), submentalis EMG (chin), nasal and oral airflow, thoracic and abdominal wall motion, anterior tibialis EMG, snore microphone, electrocardiogram, and pulse oximetry. Continuous positive airway pressure (CPAP) was initiated when the patient met split night criteria and was titrated according to treat sleep-disordered breathing.  MEDICATIONS ibuprofen (ADVIL,MOTRIN) 800 MG tablet    Take 1 tablet (800 mg total) by mouth 3 (three) times daily with meals.     ranitidine (ZANTAC) 150 MG tablet    Take 150 mg by mouth daily.          Medications self-administered by patient taken the night of the study : N/A  RESPIRATORY PARAMETERS Diagnostic Total AHI (/hr): 118.2 RDI (/hr): 126.2 OA Index (/hr): 95.5 CA Index (/hr): 0.0 REM AHI (/hr): N/A NREM AHI (/hr): 118.2 Supine AHI (/hr): 120.3 Non-supine AHI (/hr): 109.14 Min O2 Sat (%): 78.00 Mean O2 (%): 90.20 Time below 88% (min): 39.6    Titration Optimal Pressure (cm): 12 AHI at Optimal Pressure (/hr): 2.3 Min O2 at Optimal Pressure (%): 93.0 Supine % at Optimal (%): 100 Sleep % at Optimal (%): 100    SLEEP ARCHITECTURE The recording time for the entire night was 393.8 minutes.  During a baseline period of 146.8 minutes, the patient slept for 134.5 minutes in REM and nonREM,  yielding a sleep efficiency of 91.6%. Sleep onset after lights out was 4.9 minutes with a REM latency of N/A minutes. The patient spent 1.86% of the night in stage N1 sleep, 98.14% in stage N2 sleep, 0.00% in stage N3 and 0.00% in REM.  During the titration period of 241.5 minutes, the patient slept for 240.5 minutes in REM and nonREM, yielding a sleep efficiency of 99.6%. Sleep onset after CPAP initiation was 0.0 minutes with a REM latency of 7.7 minutes. The patient spent 1.32% of the night in stage N1 sleep, 21.83% in stage N2 sleep, 33.47% in stage N3 and 43.39% in REM.  CARDIAC DATA The 2 lead EKG demonstrated sinus rhythm. The mean heart rate was 78.39 beats per minute. Other EKG findings include: None.   LEG MOVEMENT DATA The total Periodic Limb Movements of Sleep (PLMS) were 15. The PLMS index was 2.39 .  IMPRESSIONS - Very severe obstructive sleep apnea occurred during the diagnostic portion of the study (AHI 118.2/hour). CPAP was initiated and was titrated to an optimal PAP pressure 12 cm water pressure. - No significant central sleep apnea occurred during the diagnostic portion of the study (CAI = 0.0/hour). - Significant oxygen desaturation was noted during the diagnostic portion of the study to a nadir of 78%. - The patient snored with Moderate snoring volume during the diagnostic portion of the study. - Abnormal sleep architecture during the diagnostic portion of the study with absence of  slow wave andREM sleep. - REM rebound was observed with CPAP titration. - No cardiac abnormalities were noted during this study. - Clinically significant periodic  limb movements did not occur during sleep.  DIAGNOSIS - Obstructive Sleep Apnea (327.23 [G47.33 ICD-10])  RECOMMENDATIONS - Recommended initial trial of CPAP therapy at 12 cm H2O with heated humidification.. A Medium size Resmed Full Face Mask AirFit F20 mask was used for the titration study. - Efforts should be made to optimize  nasal and oral pharyngeal patency - Avoid alcohol, sedatives and other CNS depressants that may worsen sleep apnea and disrupt normal sleep architecture. - Sleep hygiene should be reviewed to assess factors that may improve sleep quality. - Weight management and regular exercise should be initiated or continued. - Recommend a download be obtained in 30 days and sleep clinic evaluation after 4 weeks of therapy.  [Electronically signed] 12/07/2016 01:06 PM  Shelva Majestic MD, [facility], ABSM Diplomate, American Board of Sleep Medicine NPI: [id number]   [place] PH: [phone]   FX: [phone] [place]

## 2016-12-09 ENCOUNTER — Telehealth: Payer: Self-pay | Admitting: Internal Medicine

## 2016-12-09 DIAGNOSIS — G4733 Obstructive sleep apnea (adult) (pediatric): Secondary | ICD-10-CM

## 2016-12-09 NOTE — Telephone Encounter (Signed)
PC placed to pt this a.m. I left message on cell phone informing him that his sleep study did confirm that he has sleep apnea and CPAP machine recommended.  I will generate a prescription for this device and leave with me nurse. Pt advised that if he does not have insurance, he can let us know so that we can help him get a refurbish one.  Also advised to avoid drinking alcohol or taking sedatives at nights as these make symptoms of sleep apnea worse. Also encourage wgh loss through healthy eating and regular exercise.

## 2016-12-12 ENCOUNTER — Telehealth: Payer: Self-pay | Admitting: *Deleted

## 2016-12-12 NOTE — Telephone Encounter (Signed)
Gave cpap rx to [facility]

## 2016-12-12 NOTE — Telephone Encounter (Signed)
Left message to call back  

## 2016-12-12 NOTE — Progress Notes (Signed)
lmtcb 8/29 

## 2016-12-12 NOTE — Telephone Encounter (Signed)
-----   Message from Lennette Biharihomas A Kelly, MD sent at 12/07/2016  1:12 PM EDT ----- Ricky Evans; please set up with DME company for initiation of CPAP therapy at home.  The patient has severe sleep apnea with significant oxygen desaturation.  Arrange for download in 4 weeks with follow-up sleep clinic evaluation.

## 2016-12-12 NOTE — Telephone Encounter (Signed)
Call placed to the patient to discuss ordering a CPAP machine. The patient stated that he does not have insurance but is working.   Explained to him that a CPAP can be [facility], and they will contact him with the price of the machine. Also explained that a refurbished machine can be ordered through the American Sleep Apnea Association (ASAA)  CPAP assistance program. The cost for those machines are  $100 and there is no guarantee of the machine. This CM also explained to him that humidification is  ordered for him and the machines from [employer]AA do not usually come with humidification.   The patient stated that he would like to check the cost of the new machine and would prefer that to the re-furbished one.   Explained to him that the [facility] would [facility] and they would contact him with the price. He was appreciative of the help.  Instructed him to contact this CM if he has any further questions.

## 2016-12-14 ENCOUNTER — Telehealth: Payer: Self-pay | Admitting: Internal Medicine

## 2016-12-14 NOTE — Telephone Encounter (Signed)
Call placed to [facility] [phone] regarding referral that was faxed on 12/12/16. Spoke with Perlie GoldRussell and he informed me that documents were received and [facility] 10am.

## 2016-12-20 ENCOUNTER — Telehealth: Payer: Self-pay | Admitting: Internal Medicine

## 2016-12-20 NOTE — Telephone Encounter (Signed)
[facility] with Dr. Tresa EndoKelly called requesting to know if PCP will be following up with provider regarding sleep study, pt receieved cpap machine 2 days ago and since we ordered machine and supplies they are not sure who will follow up with patient. Please f/up

## 2016-12-20 NOTE — Progress Notes (Signed)
See telephone note 9/6

## 2016-12-20 NOTE — Telephone Encounter (Signed)
Will forward to pcp

## 2016-12-20 NOTE — Telephone Encounter (Signed)
Called patient, informed of sleep study results and states he has [facility].     Per chart review:  [facility]Johnson and [facility] and will follow.   Advised patient to call our office if he has any questions or concerns.         Called Dr. Laural BenesJohnson office to verify she will follow.   She is PCP and does not follow sleep.   Office will call back to follow up.

## 2016-12-31 ENCOUNTER — Encounter: Payer: Self-pay | Admitting: Internal Medicine

## 2016-12-31 ENCOUNTER — Ambulatory Visit: Payer: Self-pay | Attending: Internal Medicine | Admitting: Internal Medicine

## 2016-12-31 VITALS — BP 122/82 | HR 105 | Temp 99.2°F | Resp 16 | Wt 240.2 lb

## 2016-12-31 DIAGNOSIS — Z09 Encounter for follow-up examination after completed treatment for conditions other than malignant neoplasm: Secondary | ICD-10-CM | POA: Insufficient documentation

## 2016-12-31 DIAGNOSIS — F1099 Alcohol use, unspecified with unspecified alcohol-induced disorder: Secondary | ICD-10-CM

## 2016-12-31 DIAGNOSIS — Z720 Tobacco use: Secondary | ICD-10-CM

## 2016-12-31 DIAGNOSIS — F17210 Nicotine dependence, cigarettes, uncomplicated: Secondary | ICD-10-CM | POA: Insufficient documentation

## 2016-12-31 DIAGNOSIS — G4733 Obstructive sleep apnea (adult) (pediatric): Secondary | ICD-10-CM | POA: Insufficient documentation

## 2016-12-31 DIAGNOSIS — Z2821 Immunization not carried out because of patient refusal: Secondary | ICD-10-CM

## 2016-12-31 DIAGNOSIS — F1010 Alcohol abuse, uncomplicated: Secondary | ICD-10-CM | POA: Insufficient documentation

## 2016-12-31 DIAGNOSIS — IMO0002 Reserved for concepts with insufficient information to code with codable children: Secondary | ICD-10-CM

## 2016-12-31 DIAGNOSIS — Z9989 Dependence on other enabling machines and devices: Secondary | ICD-10-CM

## 2016-12-31 NOTE — Progress Notes (Signed)
Patient ID: DANA DORNER, male    DOB: Apr 16, 1991  MRN: [id number]  CC: Follow-up   Subjective: Ricky Evans is a 26 y.o. male who presents for chronic ds management His concerns today include:  1.  CPAP: -just got CPAP 2 wks ago.  Feeling much better -every morning wakes up with discomfort in lower back when he breaths in. Last about 10-15 mins. -no dysuria, cough, SOB.   2. Still smoking. Not wanting to quit  3. " I have cut back on ETOH tremendously." From daily to just Friday or Saturday night -Fails to quantify the amount Patient Active Problem List   Diagnosis Date Noted  . Tobacco abuse 09/13/2016  . Alcohol use disorder (HCC) 09/13/2016  . Habitual snoring 09/13/2016  . OSA (obstructive sleep apnea) 08/30/2016  . Acute myopericarditis 08/29/2016     Current Outpatient Prescriptions on File Prior to Visit  Medication Sig Dispense Refill  . colchicine 0.6 MG tablet Take 1 tablet (0.6 mg total) by mouth 2 (two) times daily. 60 tablet 1  . ibuprofen (ADVIL,MOTRIN) 800 MG tablet Take 1 tablet (800 mg total) by mouth 3 (three) times daily with meals. 30 tablet 0  . ranitidine (ZANTAC) 150 MG tablet Take 150 mg by mouth daily.     No current facility-administered medications on file prior to visit.     Allergies  Allergen Reactions  . Amoxil [Amoxicillin] Hives    Social History   Social History  . Marital status: Single    Spouse name: N/A  . Number of children: N/A  . Years of education: N/A   Occupational History  . Grading     Social History Main Topics  . Smoking status: Current Every Day Smoker    Packs/day: 0.50    Years: 6.00    Types: Cigarettes  . Smokeless tobacco: Current User  . Alcohol use 25.2 oz/week    42 Cans of beer per week     Comment: 08/29/2016 "@ least 6 plus beers/day"  . Drug use: No  . Sexual activity: Yes    Birth control/ protection: IUD     Comment: girlfriend of 2 months,    Other Topics Concern  . Not on file     Social History Narrative  . No narrative on file    Family History  Problem Relation Age of Onset  . Hypertension Mother   . Cancer Paternal Grandmother        lung  . Cancer Paternal Grandfather        lung    Past Surgical History:  Procedure Laterality Date  . CARDIAC CATHETERIZATION  08/29/2016   "clean"  . LEFT HEART CATH AND CORONARY ANGIOGRAPHY N/A 08/29/2016   Procedure: Left Heart Cath and Coronary Angiography;  Surgeon: Swaziland, Peter M, MD;  [facility] LAB;  Service: Cardiovascular;  Laterality: N/A;    ROS: Review of Systems Negative except as stated above PHYSICAL EXAM: BP 122/82   Pulse (!) 105   Temp 99.2 F (37.3 C) (Oral)   Resp 16   Wt 240 lb 3.2 oz (109 kg)   SpO2 98%   BMI 38.77 kg/m   Physical Exam General appearance - alert, well appearing, obese Caucasian male and in no distress Mental status - alert, oriented to person, place, and time, normal mood, behavior, speech, dress, motor activity, and thought processes Neck - supple, no significant adenopathy Chest - clear to auscultation, no wheezes, rales or rhonchi, symmetric  air entry Heart - normal rate, regular rhythm, normal S1, S2, no murmurs, rubs, clicks or gallops Extremities - peripheral pulses normal, no pedal edema, no clubbing or cyanosis   ASSESSMENT AND PLAN: 1. OSA on CPAP -Patient feeling better on CPAP. Encouraged him to continue to be compliant with using the machine  2. Tobacco abuse Patient advised to quit smoking. Discussed health risks associated with smoking including lung and other types of cancers, chronic lung diseases and CV risks.. Pt not ready to give trail of quitting.    3. Alcohol use disorder (HCC) -Commended him on cutting back. Encouraged him to cut back even more and to avoid binge drinking on the weekends  4. Influenza vaccination declined   Patient was given the opportunity to ask questions.  Patient verbalized understanding of the plan  and was able to repeat key elements of the plan.   No orders of the defined types were placed in this encounter.    Requested Prescriptions    No prescriptions requested or ordered in this encounter    Return in about 5 months (around 06/02/2017).  Jonah Blue, MD, FACP

## 2017-01-02 ENCOUNTER — Telehealth: Payer: Self-pay

## 2017-01-02 NOTE — Telephone Encounter (Signed)
Tried to call pt back 2 times this a.m. Phone rings three times then sounds like a pick up then goes out. Will have CMA try to reach again later.  Let pt know that I will send an email to sleep med physician who read his sleep study to see if we need to do another titration study vs just decreasing the CPAP pressure by 1-2 units of pressure (cm H2O pressure)

## 2017-01-02 NOTE — Telephone Encounter (Signed)
Pt contacted the office and stated that he contacted [facility] about how the cpap hurts his chest when he gets up in the morning per [facility] he will need a decrease in titration. Please F/U

## 2017-01-04 ENCOUNTER — Telehealth: Payer: Self-pay

## 2017-01-04 ENCOUNTER — Telehealth: Payer: Self-pay | Admitting: Internal Medicine

## 2017-01-04 NOTE — Telephone Encounter (Signed)
Order received from Dr Laural Benes to decrease patient's CPAP pressure.   Call placed to [facility] - CPAP department # [phone] x [phone] and spoke to [place] who stated that they need to have the prescription and will then call the patient to schedule a time to reset the machine. They may be able to do it remotely.  Prescription for CPAP orders faxed to [facility]

## 2017-01-04 NOTE — Telephone Encounter (Signed)
Patient returned my call. Informed pt that an order was sent to [facility] in regards to decreasing the pressure on his CPAP machine. Also, informed him that [employer] will be contacting him between today and Monday regarding this matter. Patient understood and had no further questions or concern.

## 2017-01-04 NOTE — Telephone Encounter (Signed)
Call placed to patient # [phone] in regards to his CPAP. No answer. Left patient a message to return my call at #[phone].

## 2017-01-06 NOTE — Telephone Encounter (Signed)
-----   Message from Marcine Matar, MD sent at 01/02/2017 12:27 PM EDT ----- Regarding: Question regarding sleep study I saw this young male a few days ago for routine f/u in the office. He reports good results with CPAP ie waking up feeling more refreshed. However, he notes slight feeling of pain in the chest and lower back when he first wakes up with mask on and lasts for about 10 mins after removing it. [facility] told him he probably needs a decrease in titration. My question is, does he need another titration study or should we have him just decrease pressure from 12 to say 11 or 10?

## 2017-01-06 NOTE — Telephone Encounter (Signed)
I did not here back from sleep specialist, so I sent written rxn to [facility] with order to decrease pressure on CPAP from 12 to 11 cm H2O.

## 2017-01-08 NOTE — Telephone Encounter (Signed)
Pt is aware of decrease

## 2017-01-09 ENCOUNTER — Telehealth: Payer: Self-pay

## 2017-01-09 NOTE — Telephone Encounter (Signed)
Call placed to [facility] to confirm receipt of the order for CPAP pressure change.  Spoke to [place] who stated that the patient was contacted on 01/08/17 regarding the change in pressure from 12 cm H2O to 11 cm H2O.

## 2018-02-08 IMAGING — DX DG CHEST 1V PORT
1 series · 1 of 1 positions shown · non-contrast
Comparison: Frontal and lateral views 03/19/2012

CLINICAL DATA: Chest pain.

EXAM:
PORTABLE CHEST 1 VIEW

[chest ap]
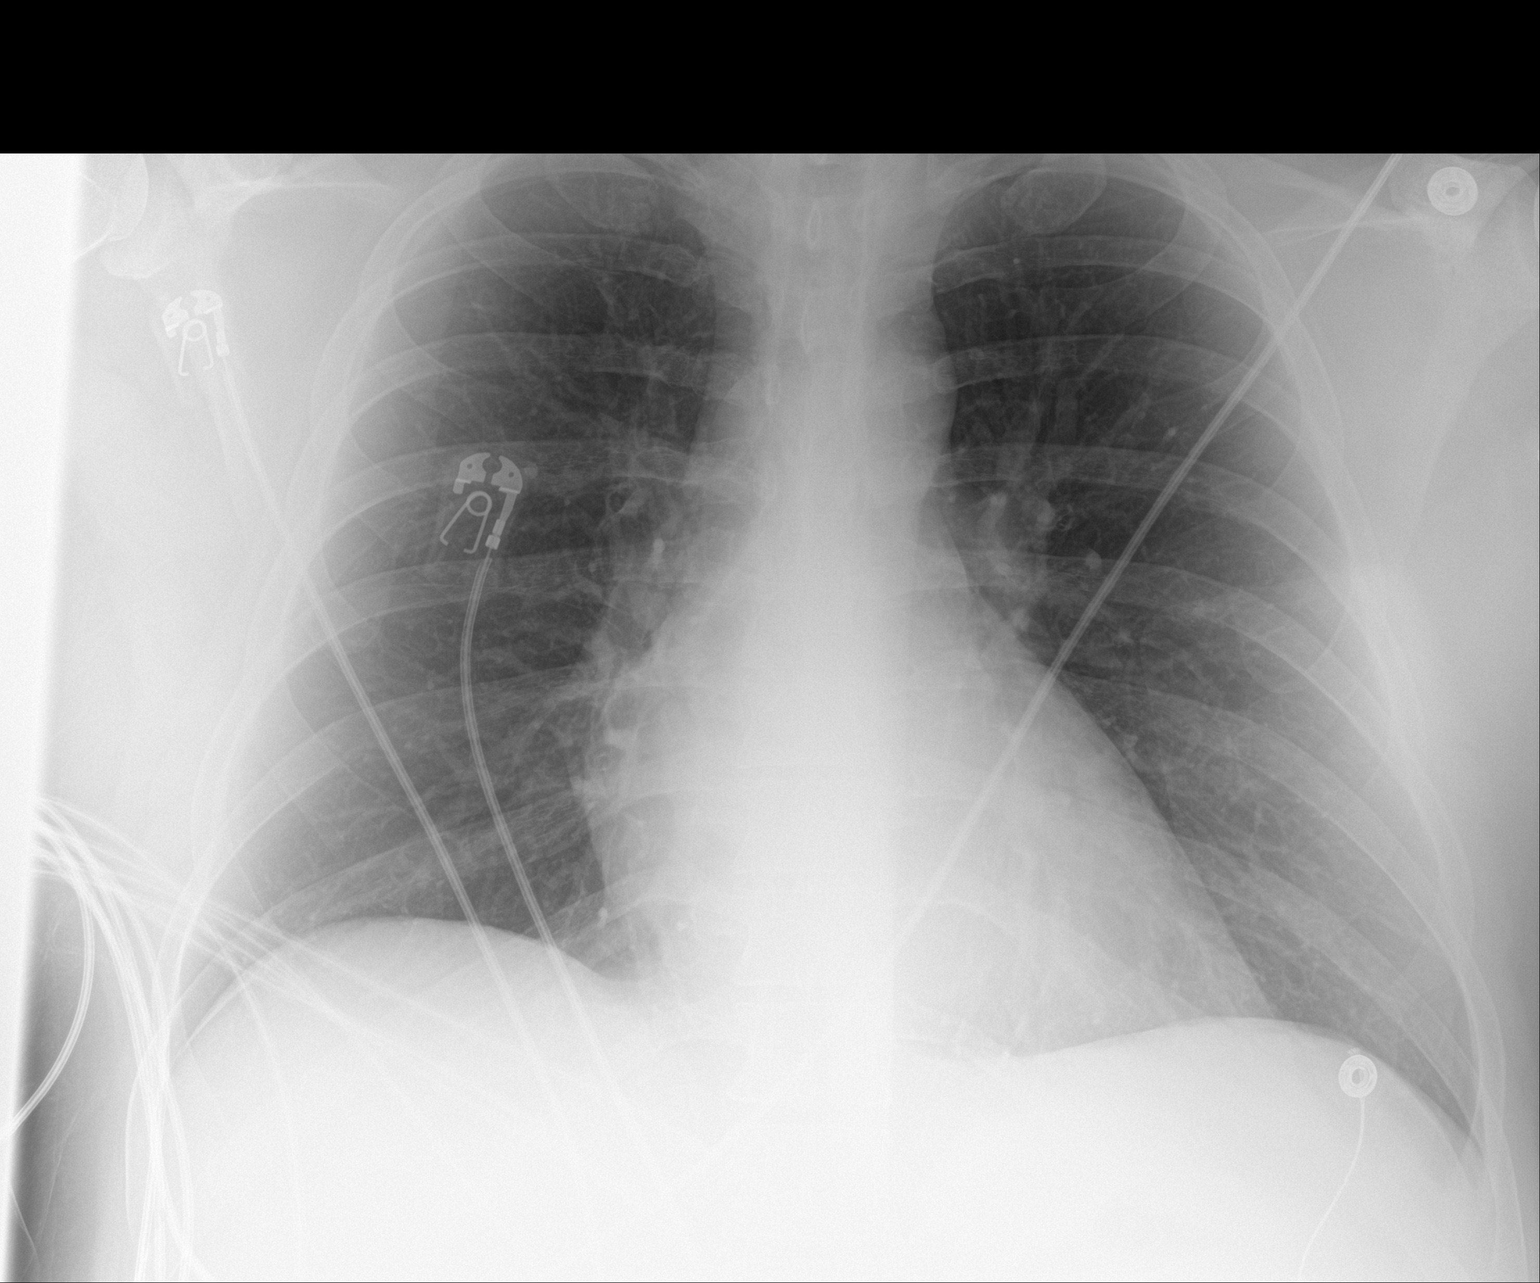

[1 of 1 positions shown; findings below may reference images not displayed]

FINDINGS: Heart size at the upper limits of normal likely accentuated by AP
technique. Mediastinal contours are normal. No pulmonary edema,
pleural fluid, focal airspace disease, or pneumothorax. No acute
osseous abnormalities.
IMPRESSION: Heart at the upper limits of normal in size likely accentuated by AP
technique. No localizing pulmonary abnormality.

## 2019-03-24 ENCOUNTER — Other Ambulatory Visit: Payer: Self-pay

## 2019-03-24 DIAGNOSIS — Z20822 Contact with and (suspected) exposure to covid-19: Secondary | ICD-10-CM

## 2019-03-26 LAB — NOVEL CORONAVIRUS, NAA: SARS-CoV-2, NAA: NOT DETECTED
